# Patient Record
Sex: Male | Born: 1970 | Race: White | Hispanic: No | Marital: Single | Smoking: Current every day smoker
Health system: Southern US, Community
[De-identification: ages and names within clinical notes are randomized; demographics above are authoritative.]

## PROBLEM LIST (undated history)

## (undated) DIAGNOSIS — I10 Essential (primary) hypertension: Secondary | ICD-10-CM

## (undated) DIAGNOSIS — F1910 Other psychoactive substance abuse, uncomplicated: Secondary | ICD-10-CM

## (undated) HISTORY — DX: Essential (primary) hypertension: I10

## (undated) HISTORY — PX: OTHER SURGICAL HISTORY: SHX169

## (undated) HISTORY — PX: KNEE SURGERY: SHX244

---

## 2003-02-25 ENCOUNTER — Encounter: Payer: Self-pay | Admitting: Emergency Medicine

## 2003-02-25 ENCOUNTER — Emergency Department (HOSPITAL_COMMUNITY): Admission: EM | Admit: 2003-02-25 | Discharge: 2003-02-25 | Payer: Self-pay | Admitting: Emergency Medicine

## 2004-01-12 ENCOUNTER — Emergency Department (HOSPITAL_COMMUNITY): Admission: EM | Admit: 2004-01-12 | Discharge: 2004-01-13 | Payer: Self-pay | Admitting: Emergency Medicine

## 2005-12-02 ENCOUNTER — Emergency Department (HOSPITAL_COMMUNITY): Admission: EM | Admit: 2005-12-02 | Discharge: 2005-12-02 | Payer: Self-pay | Admitting: Emergency Medicine

## 2007-06-14 ENCOUNTER — Emergency Department (HOSPITAL_COMMUNITY): Admission: EM | Admit: 2007-06-14 | Discharge: 2007-06-14 | Payer: Self-pay | Admitting: Emergency Medicine

## 2008-06-27 ENCOUNTER — Emergency Department (HOSPITAL_COMMUNITY): Admission: EM | Admit: 2008-06-27 | Discharge: 2008-06-27 | Payer: Self-pay | Admitting: Emergency Medicine

## 2008-11-08 ENCOUNTER — Emergency Department (HOSPITAL_COMMUNITY): Admission: EM | Admit: 2008-11-08 | Discharge: 2008-11-08 | Payer: Self-pay | Admitting: Emergency Medicine

## 2008-12-23 ENCOUNTER — Emergency Department (HOSPITAL_COMMUNITY): Admission: EM | Admit: 2008-12-23 | Discharge: 2008-12-23 | Payer: Self-pay | Admitting: Emergency Medicine

## 2009-01-06 ENCOUNTER — Emergency Department (HOSPITAL_COMMUNITY): Admission: EM | Admit: 2009-01-06 | Discharge: 2009-01-06 | Payer: Self-pay | Admitting: Emergency Medicine

## 2009-02-25 ENCOUNTER — Emergency Department (HOSPITAL_COMMUNITY): Admission: EM | Admit: 2009-02-25 | Discharge: 2009-02-25 | Payer: Self-pay | Admitting: Emergency Medicine

## 2009-03-30 ENCOUNTER — Emergency Department (HOSPITAL_COMMUNITY): Admission: EM | Admit: 2009-03-30 | Discharge: 2009-03-30 | Payer: Self-pay | Admitting: Emergency Medicine

## 2010-04-02 ENCOUNTER — Emergency Department (HOSPITAL_COMMUNITY): Admission: EM | Admit: 2010-04-02 | Discharge: 2010-04-02 | Payer: Self-pay | Admitting: Emergency Medicine

## 2010-07-29 LAB — BASIC METABOLIC PANEL
CO2: 26 mEq/L (ref 19–32)
Calcium: 9.7 mg/dL (ref 8.4–10.5)
GFR calc Af Amer: >60 mL/min (ref >60)
GFR calc non Af Amer: >60 mL/min (ref >60)
Sodium: 140 mEq/L (ref 135–145)

## 2010-07-29 LAB — CBC
Hemoglobin: 16.3 g/dL (ref 13.0–17.0)
RBC: 5.06 MIL/uL (ref 4.22–5.81)
WBC: 8.5 10*3/uL (ref 4.0–10.5)

## 2010-07-29 LAB — URINALYSIS, ROUTINE W REFLEX MICROSCOPIC
Bilirubin Urine: NEGATIVE
Glucose, UA: NEGATIVE mg/dL
Hgb urine dipstick: NEGATIVE
Ketones, ur: NEGATIVE mg/dL
Nitrite: NEGATIVE
Protein, ur: NEGATIVE mg/dL
Specific Gravity, Urine: 1.013 (ref 1.005–1.030)
Urobilinogen, UA: 0.2 mg/dL (ref 0.0–1.0)
pH: 6.5 (ref 5.0–8.0)

## 2010-07-29 LAB — DIFFERENTIAL
Lymphocytes Relative: 25 % (ref 12–46)
Lymphs Abs: 2.1 10*3/uL (ref 0.7–4.0)
Monocytes Relative: 9 % (ref 3–12)
Neutro Abs: 5.5 10*3/uL (ref 1.7–7.7)
Neutrophils Relative %: 65 % (ref 43–77)

## 2010-07-29 LAB — POCT I-STAT, CHEM 8
BUN: 5 mg/dL — ABNORMAL LOW (ref 6–23)
Hemoglobin: 17 g/dL (ref 13.0–17.0)
Potassium: 4.1 mEq/L (ref 3.5–5.1)
TCO2: 26 mmol/L (ref 0–100)

## 2010-08-23 LAB — WOUND CULTURE

## 2010-08-25 LAB — DIFFERENTIAL
Basophils Absolute: 0 K/uL (ref 0.0–0.1)
Basophils Relative: 0 % (ref 0–1)
Eosinophils Absolute: 0.5 K/uL (ref 0.0–0.7)
Eosinophils Relative: 5 % (ref 0–5)
Lymphocytes Relative: 25 % (ref 12–46)
Lymphs Abs: 2.4 10*3/uL (ref 0.7–4.0)
Monocytes Absolute: 0.8 10*3/uL (ref 0.1–1.0)
Monocytes Relative: 8 % (ref 3–12)
Neutro Abs: 5.9 10*3/uL (ref 1.7–7.7)
Neutrophils Relative %: 62 % (ref 43–77)

## 2010-08-25 LAB — CBC
HCT: 52.8 % — ABNORMAL HIGH (ref 39.0–52.0)
Hemoglobin: 18 g/dL — ABNORMAL HIGH (ref 13.0–17.0)
MCHC: 34.1 g/dL (ref 30.0–36.0)
MCV: 94.2 fL (ref 78.0–100.0)
Platelets: 254 10*3/uL (ref 150–400)
RBC: 5.61 MIL/uL (ref 4.22–5.81)
RDW: 13.3 % (ref 11.5–15.5)
WBC: 9.6 K/uL (ref 4.0–10.5)

## 2010-08-25 LAB — COMPREHENSIVE METABOLIC PANEL WITH GFR
ALT: 28 U/L (ref 0–53)
AST: 22 U/L (ref 0–37)
CO2: 27 meq/L (ref 19–32)
Chloride: 104 meq/L (ref 96–112)
Creatinine, Ser: 1.02 mg/dL (ref 0.4–1.5)
GFR calc Af Amer: >60 mL/min (ref >60)
GFR calc non Af Amer: >60 mL/min (ref >60)
Glucose, Bld: 82 mg/dL (ref 70–99)
Total Bilirubin: 0.7 mg/dL (ref 0.3–1.2)

## 2010-08-25 LAB — COMPREHENSIVE METABOLIC PANEL
Albumin: 4.8 g/dL (ref 3.5–5.2)
Alkaline Phosphatase: 61 U/L (ref 39–117)
BUN: 10 mg/dL (ref 6–23)
Calcium: 10 mg/dL (ref 8.4–10.5)
Potassium: 4 mEq/L (ref 3.5–5.1)
Sodium: 140 mEq/L (ref 135–145)
Total Protein: 7.7 g/dL (ref 6.0–8.3)

## 2010-08-25 LAB — URINALYSIS, ROUTINE W REFLEX MICROSCOPIC
Bilirubin Urine: NEGATIVE
Glucose, UA: NEGATIVE mg/dL
Hgb urine dipstick: NEGATIVE
Ketones, ur: NEGATIVE mg/dL
Nitrite: NEGATIVE
Protein, ur: NEGATIVE mg/dL
Specific Gravity, Urine: 1.015 (ref 1.005–1.030)
Urobilinogen, UA: 0.2 mg/dL (ref 0.0–1.0)
pH: 7 (ref 5.0–8.0)

## 2010-08-25 LAB — LIPASE, BLOOD: Lipase: 38 U/L (ref 11–59)

## 2010-09-08 ENCOUNTER — Ambulatory Visit (INDEPENDENT_AMBULATORY_CARE_PROVIDER_SITE_OTHER): Payer: Self-pay | Admitting: Internal Medicine

## 2010-09-08 ENCOUNTER — Encounter: Payer: Self-pay | Admitting: Internal Medicine

## 2010-09-08 VITALS — BP 120/88 | HR 90 | Ht 72.5 in | Wt 161.0 lb

## 2010-09-08 DIAGNOSIS — M25569 Pain in unspecified knee: Secondary | ICD-10-CM

## 2010-09-08 DIAGNOSIS — R634 Abnormal weight loss: Secondary | ICD-10-CM

## 2010-09-08 DIAGNOSIS — G4700 Insomnia, unspecified: Secondary | ICD-10-CM

## 2010-09-08 LAB — CBC WITH DIFFERENTIAL/PLATELET
Basophils Relative: 0.7 % (ref 0.0–3.0)
Eosinophils Absolute: 0.3 10*3/uL (ref 0.0–0.7)
Eosinophils Relative: 3.6 % (ref 0.0–5.0)
HCT: 41.9 % (ref 39.0–52.0)
Hemoglobin: 14.7 g/dL (ref 13.0–17.0)
Lymphs Abs: 3 10*3/uL (ref 0.7–4.0)
MCHC: 35 g/dL (ref 30.0–36.0)
MCV: 92.5 fl (ref 78.0–100.0)
Monocytes Absolute: 0.8 10*3/uL (ref 0.1–1.0)
Neutro Abs: 5.2 10*3/uL (ref 1.4–7.7)
RBC: 4.53 Mil/uL (ref 4.22–5.81)
WBC: 9.4 10*3/uL (ref 4.5–10.5)

## 2010-09-08 LAB — TSH: TSH: 1.97 u[IU]/mL (ref 0.35–5.50)

## 2010-09-08 LAB — HEPATIC FUNCTION PANEL
Albumin: 4.1 g/dL (ref 3.5–5.2)
Alkaline Phosphatase: 43 U/L (ref 39–117)
Total Protein: 6.5 g/dL (ref 6.0–8.3)

## 2010-09-08 LAB — BASIC METABOLIC PANEL
BUN: 13 mg/dL (ref 6–23)
Creatinine, Ser: 0.9 mg/dL (ref 0.4–1.5)
GFR: 101.42 mL/min (ref >60.00)
Glucose, Bld: 76 mg/dL (ref 70–99)

## 2010-09-08 MED ORDER — SULFAMETHOXAZOLE-TRIMETHOPRIM 800-160 MG PO TABS: 1.0000 | ORAL_TABLET | Freq: Two times a day (BID) | ORAL | Status: AC

## 2010-09-08 MED ORDER — HYDROCODONE-ACETAMINOPHEN 5-500 MG PO TABS: 2.0000 | ORAL_TABLET | Freq: Four times a day (QID) | ORAL | Status: AC | PRN

## 2010-09-08 MED ORDER — IBUPROFEN 800 MG PO TABS: 800.0000 mg | ORAL_TABLET | Freq: Three times a day (TID) | ORAL | Status: AC | PRN

## 2010-09-14 ENCOUNTER — Encounter: Payer: Self-pay | Admitting: Internal Medicine

## 2010-09-14 DIAGNOSIS — M25569 Pain in unspecified knee: Secondary | ICD-10-CM | POA: Insufficient documentation

## 2010-09-14 DIAGNOSIS — G4700 Insomnia, unspecified: Secondary | ICD-10-CM | POA: Insufficient documentation

## 2010-09-14 DIAGNOSIS — R634 Abnormal weight loss: Secondary | ICD-10-CM | POA: Insufficient documentation

## 2010-09-14 NOTE — Progress Notes (Signed)
  Subjective:    Patient ID: Aaron Roberson, male    DOB: [birth year], 40 y.o.   MRN: [id number]  HPI patient is a sclerotic establish primary care.States an intended weight loss of approximately 25 pounds over the past year. Has had no evaluation.No fever chills or sweats. Denies recent illnesses. Does have history of possible prostatitis and complains of nocturia 3 times a night and urinary difficulty. No hematuria. States has undergone multiple knee surgeries and is currently deferring additional knee surgery. Has intermittent mild effusions. Has no instability. Previous x-ray of right knee reviewed February 2010 demonstrating slight effusion and no other abnormalities. Planes of recent insomnia with difficulty initiating sleep.  Reviewed past medical history, past surgical history, medications, allergies, social history and family history.  Review of Systems  Constitutional: Positive for unexpected weight change.  Genitourinary: Positive for difficulty urinating. Negative for hematuria.  Musculoskeletal: Positive for joint swelling and arthralgias. Negative for back pain and gait problem.  Psychiatric/Behavioral: Positive for sleep disturbance.  All other systems reviewed and are negative.       Objective:   Physical Exam    Physical Exam  Vitals reviewed. Constitutional:  appears well-developed and well-nourished. No distress.  HENT:  Head: Normocephalic and atraumatic.  Right Ear: Tympanic membrane, external ear and ear canal normal.  Left Ear: Tympanic membrane, external ear and ear canal normal.  Nose: Nose normal.  Mouth/Throat: Oropharynx is clear and moist. No oropharyngeal exudate.  Eyes: Conjunctivae and EOM are normal. Pupils are equal, round, and reactive to light. Right eye exhibits no discharge. Left eye exhibits no discharge. No scleral icterus.  Neck: Neck supple. No thyromegaly present.  Cardiovascular: Normal rate, regular rhythm and normal heart sounds.  Exam  reveals no gallop and no friction rub.   No murmur heard. Pulmonary/Chest: Effort normal and breath sounds normal. No respiratory distress.  has no wheezes.  has no rales.  Abdomen: Soft, nondistended nontender, positive bowel sounds. No masses or organomegaly appreciated. Lymphadenopathy:   no cervical adenopathy.  Neurological:  is alert.  Skin: Skin is warm and dry.  not diaphoretic.  Muscle skeletal: Right knee reveals possible slight effusion. Nontender.Full range of motion. Able to weight-bear and ambulate without assistance. Psychiatric: normal mood and affect.      Assessment & Plan:

## 2010-09-14 NOTE — Assessment & Plan Note (Signed)
Unintended. Obtain CBC, Chem-7, liver function test and TSH. Schedule chest x-ray. Schedule followup for reevaluation.

## 2010-09-14 NOTE — Assessment & Plan Note (Signed)
Recommend OTC sleep aid p.r.n.

## 2010-09-14 NOTE — Assessment & Plan Note (Signed)
Chronic. AttemptsMotrin with food and other anti-inflammatories. Short duration of hydrocodone. Advise will not be long-term medication.Caution regarding possible sedating effect as well as addiction and/or tolerance. If pain persists recommend orthopedic followup

## 2010-09-15 ENCOUNTER — Telehealth: Payer: Self-pay

## 2010-09-15 NOTE — Telephone Encounter (Signed)
Message copied by Kyung Rudd on Mon Sep 15, 2010  3:03 PM ------      Message from: Letitia Libra, Maisie Fus      Created: Sun Sep 14, 2010 11:21 AM       Labs nl

## 2010-09-17 ENCOUNTER — Telehealth: Payer: Self-pay

## 2010-09-17 NOTE — Telephone Encounter (Signed)
Message copied by Kyung Rudd on Wed Sep 17, 2010 11:06 AM ------      Message from: Letitia Libra, Maisie Fus      Created: Sun Sep 14, 2010 11:21 AM       Labs nl

## 2010-10-06 ENCOUNTER — Telehealth: Payer: Self-pay | Admitting: Internal Medicine

## 2010-10-06 ENCOUNTER — Other Ambulatory Visit: Payer: Self-pay

## 2010-10-06 MED ORDER — TRAMADOL HCL 50 MG PO TABS: 50.0000 mg | ORAL_TABLET | Freq: Four times a day (QID) | ORAL | Status: AC | PRN

## 2010-10-06 NOTE — Telephone Encounter (Signed)
As discussed in clinic if pain persists recommend ortho evaluation. Ok for ultram 50mg  po q6 hours prn pain #30 rf 1 if no interactions.

## 2010-10-06 NOTE — Telephone Encounter (Signed)
Pt called and is req to get an early refill of Hydrocodone for knee pain. Pt is extreme amount of pain. Due to be fill on 10/08/10. Pt out of med. Pls call in to [facility] on [address].

## 2010-10-06 NOTE — Telephone Encounter (Signed)
Rx denied on Friday, 10/03/2010, by Dr. Rodena Medin. Per Dr. Rodena Medin, pt would have to be willing to go to orthopedics before getting pain meds. Pt states that he does not have insurance yet and requests that Dr. Rodena Medin rx something for pain even if it is not a narcotic. Pls advise

## 2010-10-06 NOTE — Telephone Encounter (Signed)
Refill denied by Dr. Rodena Medin. Pt received refills on 09/08/2010 and 09/13/2010

## 2010-10-20 ENCOUNTER — Ambulatory Visit: Payer: Self-pay | Admitting: Internal Medicine

## 2010-10-20 ENCOUNTER — Emergency Department (HOSPITAL_COMMUNITY): Payer: Self-pay

## 2010-10-20 ENCOUNTER — Emergency Department (HOSPITAL_COMMUNITY)
Admission: EM | Admit: 2010-10-20 | Discharge: 2010-10-20 | Disposition: A | Discharge status: Home / Self Care | Payer: Self-pay | Attending: Emergency Medicine | Admitting: Emergency Medicine

## 2010-10-20 DIAGNOSIS — Z0289 Encounter for other administrative examinations: Secondary | ICD-10-CM

## 2010-10-20 DIAGNOSIS — M25469 Effusion, unspecified knee: Secondary | ICD-10-CM | POA: Insufficient documentation

## 2010-10-20 DIAGNOSIS — K089 Disorder of teeth and supporting structures, unspecified: Secondary | ICD-10-CM | POA: Insufficient documentation

## 2010-10-20 DIAGNOSIS — R209 Unspecified disturbances of skin sensation: Secondary | ICD-10-CM | POA: Insufficient documentation

## 2010-10-20 DIAGNOSIS — Z9889 Other specified postprocedural states: Secondary | ICD-10-CM | POA: Insufficient documentation

## 2011-01-25 ENCOUNTER — Emergency Department (HOSPITAL_COMMUNITY)
Admission: EM | Admit: 2011-01-25 | Discharge: 2011-01-25 | Disposition: A | Discharge status: Home / Self Care | Payer: Self-pay | Attending: Emergency Medicine | Admitting: Emergency Medicine

## 2011-01-25 DIAGNOSIS — K089 Disorder of teeth and supporting structures, unspecified: Secondary | ICD-10-CM | POA: Insufficient documentation

## 2011-01-25 DIAGNOSIS — K029 Dental caries, unspecified: Secondary | ICD-10-CM | POA: Insufficient documentation

## 2011-04-05 ENCOUNTER — Emergency Department (HOSPITAL_COMMUNITY)
Admission: EM | Admit: 2011-04-05 | Discharge: 2011-04-05 | Disposition: A | Discharge status: Home / Self Care | Payer: Self-pay

## 2012-04-13 ENCOUNTER — Other Ambulatory Visit: Payer: Self-pay | Admitting: Family Medicine

## 2012-07-17 ENCOUNTER — Encounter (HOSPITAL_COMMUNITY): Payer: Self-pay | Admitting: Emergency Medicine

## 2012-07-17 ENCOUNTER — Emergency Department (HOSPITAL_COMMUNITY)
Admission: EM | Admit: 2012-07-17 | Discharge: 2012-07-17 | Disposition: A | Discharge status: Home / Self Care | Payer: Self-pay | Attending: Emergency Medicine | Admitting: Emergency Medicine

## 2012-07-17 DIAGNOSIS — K0889 Other specified disorders of teeth and supporting structures: Secondary | ICD-10-CM

## 2012-07-17 DIAGNOSIS — H9319 Tinnitus, unspecified ear: Secondary | ICD-10-CM | POA: Insufficient documentation

## 2012-07-17 DIAGNOSIS — F17200 Nicotine dependence, unspecified, uncomplicated: Secondary | ICD-10-CM | POA: Insufficient documentation

## 2012-07-17 DIAGNOSIS — I10 Essential (primary) hypertension: Secondary | ICD-10-CM | POA: Insufficient documentation

## 2012-07-17 DIAGNOSIS — K089 Disorder of teeth and supporting structures, unspecified: Secondary | ICD-10-CM | POA: Insufficient documentation

## 2012-07-17 DIAGNOSIS — K029 Dental caries, unspecified: Secondary | ICD-10-CM | POA: Insufficient documentation

## 2012-07-17 DIAGNOSIS — R6884 Jaw pain: Secondary | ICD-10-CM | POA: Insufficient documentation

## 2012-07-17 MED ORDER — OXYCODONE-ACETAMINOPHEN 5-325 MG PO TABS: 1.0000 | ORAL_TABLET | Freq: Four times a day (QID) | ORAL | Status: DC | PRN

## 2012-07-17 MED ORDER — HYDROCODONE-ACETAMINOPHEN 5-325 MG PO TABS
1.0000 | ORAL_TABLET | Freq: Once | ORAL | Status: AC
  Administered 2012-07-17: 1 via ORAL
  Filled 2012-07-17: qty 1

## 2012-07-17 MED ORDER — AMOXICILLIN 500 MG PO CAPS: 500.0000 mg | ORAL_CAPSULE | Freq: Three times a day (TID) | ORAL | Status: DC

## 2012-07-17 MED ORDER — ONDANSETRON HCL 4 MG PO TABS: 4.0000 mg | ORAL_TABLET | Freq: Four times a day (QID) | ORAL | Status: DC

## 2012-07-17 NOTE — ED Notes (Signed)
Pt reports several broken teeth. One in the right lower quadrant, another in the left lower quadrant, and another on the right upper quadrant. Oral cavity visualized and teeth are broken and are at the level of the gum line. Pt also reporting tinnitus.

## 2012-07-17 NOTE — ED Provider Notes (Signed)
History    This chart was scribed for non-physician practitioner working with Suzi Roots, MD by Melba Coon, ED Scribe. This patient was seen in room WTR6/WTR6 and the patient's care was started at 7:40PM.     CSN: [id number]  Arrival date & time 07/17/12  1907   First MD Initiated Contact with Patient 07/17/12 1932      Chief Complaint  Patient presents with  . Dental Pain  . Tinnitus    (Consider location/radiation/quality/duration/timing/severity/associated sxs/prior treatment) The history is provided by the patient. No language interpreter was used.   Aaron Roberson is a 42 y.o. male who presents to the Emergency Department complaining of constant, moderate to severe right lower, right upper,and left lower quadrant dental pain with an onset within the past week pertaining to dental fracture and cavity. He reports that he has had cavities and dental fractures in the past but only lately has he had pain severe enough to present to the ED. He reports jaw pain with difficulty chewing. He reports drainage with a nasty taste in his mouth. He reports some tinnitus as well. Denies fever, neck pain, sore throat, rash, back pain, CP, SOB, abdominal pain, nausea, emesis, diarrhea, dysuria, or extremity pain, edema, weakness, numbness, or tingling. No known allergies. No other pertinent medical symptoms.  Past Medical History  Diagnosis Date  . Hypertension     Past Surgical History  Procedure Laterality Date  . Knee surgery      x 3    Family History  Problem Relation Age of Onset  . COPD Mother   . Stroke Mother   . Hypertension Mother   . Cancer Father     prostate  . Hypertension Father   . Arthritis Maternal Grandfather   . Cancer Paternal Grandmother     breast    History  Substance Use Topics  . Smoking status: Current Every Day Smoker -- 1.00 packs/day    Types: Cigarettes  . Smokeless tobacco: Not on file  . Alcohol Use: No      Review of Systems 10  Systems reviewed and all are negative for acute change except as noted in the HPI.   Allergies  Review of patient's allergies indicates no known allergies.  Home Medications   Current Outpatient Rx  Name  Route  Sig  Dispense  Refill  . ibuprofen (ADVIL,MOTRIN) 200 MG tablet   Oral   Take 200 mg by mouth every 6 (six) hours as needed (PAIN).         Marland Kitchen amoxicillin (AMOXIL) 500 MG capsule   Oral   Take 1 capsule (500 mg total) by mouth 3 (three) times daily.   21 capsule   0   . ondansetron (ZOFRAN) 4 MG tablet   Oral   Take 1 tablet (4 mg total) by mouth every 6 (six) hours.   12 tablet   0   . oxyCODONE-acetaminophen (PERCOCET/ROXICET) 5-325 MG per tablet   Oral   Take 1 tablet by mouth every 6 (six) hours as needed for pain.   15 tablet   0     BP 136/91  Pulse 90  Temp(Src) 98.1 F (36.7 C) (Oral)  Resp 18  SpO2 98%  Physical Exam  Nursing note and vitals reviewed. Constitutional: He is oriented to person, place, and time. He appears well-developed and well-nourished. No distress.  HENT:  Head: Normocephalic and atraumatic. No trismus in the jaw.  Mouth/Throat: No oral lesions. Dental caries present. No lacerations.  Diffuse dental decay. No obvious abscess noted. Multiple broken teeth.  Eyes: Conjunctivae and EOM are normal. Pupils are equal, round, and reactive to light.  Neck: Normal range of motion. Neck supple. No tracheal deviation present.  Cardiovascular: Normal rate and regular rhythm.   Pulmonary/Chest: Effort normal and breath sounds normal. No respiratory distress.  Musculoskeletal: Normal range of motion.  Neurological: He is alert and oriented to person, place, and time.  Skin: Skin is warm and dry.  Psychiatric: He has a normal mood and affect. His behavior is normal.    ED Course  Procedures (including critical care time)  DIAGNOSTIC STUDIES: Oxygen Saturation is 98% on room air, normal by my interpretation.    COORDINATION OF  CARE:  7:43PM - pain medications and antibiotics will be ordered for [facility]. He will be referred to the on-call dentist. Advised of 24-48 hour follow-up rule.   Labs Reviewed - No data to display No results found.   1. Pain, dental       MDM  I personally performed the services described in this documentation, which was scribed in my presence. The recorded information has been reviewed and is accurate.       Dorthula Matas, PA 07/17/12 2016

## 2012-07-19 NOTE — ED Provider Notes (Signed)
Medical screening examination/treatment/procedure(s) were performed by non-physician practitioner and as supervising physician I was immediately available for consultation/collaboration.   Lashuna Tamashiro E Jalea Bronaugh, MD 07/19/12 0724 

## 2012-09-18 ENCOUNTER — Emergency Department (HOSPITAL_COMMUNITY)
Admission: EM | Admit: 2012-09-18 | Discharge: 2012-09-18 | Disposition: A | Discharge status: Home / Self Care | Payer: Self-pay | Attending: Emergency Medicine | Admitting: Emergency Medicine

## 2012-09-18 ENCOUNTER — Encounter (HOSPITAL_COMMUNITY): Payer: Self-pay | Admitting: *Deleted

## 2012-09-18 DIAGNOSIS — H9319 Tinnitus, unspecified ear: Secondary | ICD-10-CM | POA: Insufficient documentation

## 2012-09-18 DIAGNOSIS — K0889 Other specified disorders of teeth and supporting structures: Secondary | ICD-10-CM

## 2012-09-18 DIAGNOSIS — K089 Disorder of teeth and supporting structures, unspecified: Secondary | ICD-10-CM | POA: Insufficient documentation

## 2012-09-18 DIAGNOSIS — K029 Dental caries, unspecified: Secondary | ICD-10-CM | POA: Insufficient documentation

## 2012-09-18 DIAGNOSIS — Z8781 Personal history of (healed) traumatic fracture: Secondary | ICD-10-CM | POA: Insufficient documentation

## 2012-09-18 DIAGNOSIS — Z7982 Long term (current) use of aspirin: Secondary | ICD-10-CM | POA: Insufficient documentation

## 2012-09-18 DIAGNOSIS — F17200 Nicotine dependence, unspecified, uncomplicated: Secondary | ICD-10-CM | POA: Insufficient documentation

## 2012-09-18 DIAGNOSIS — R6884 Jaw pain: Secondary | ICD-10-CM | POA: Insufficient documentation

## 2012-09-18 DIAGNOSIS — I10 Essential (primary) hypertension: Secondary | ICD-10-CM | POA: Insufficient documentation

## 2012-09-18 MED ORDER — AMOXICILLIN 500 MG PO CAPS: 500.0000 mg | ORAL_CAPSULE | Freq: Three times a day (TID) | ORAL | Status: DC

## 2012-09-18 MED ORDER — OXYCODONE-ACETAMINOPHEN 5-325 MG PO TABS: 1.0000 | ORAL_TABLET | ORAL | Status: DC | PRN

## 2012-09-18 NOTE — ED Notes (Signed)
Pt states having dental pain, states just recently had teeth pulled but having problems where one was pulled top R and then dental pain top L.

## 2012-09-18 NOTE — ED Provider Notes (Signed)
Medical screening examination/treatment/procedure(s) were performed by non-physician practitioner and as supervising physician I was immediately available for consultation/collaboration.  Flint Melter, MD 09/18/12 [phone]

## 2012-09-18 NOTE — ED Provider Notes (Signed)
History     CSN: [id number]  Arrival date & time 09/18/12  1139   First MD Initiated Contact with Patient 09/18/12 1243      Chief Complaint  Patient presents with  . Dental Pain    (Consider location/radiation/quality/duration/timing/severity/associated sxs/prior treatment) HPI The history is provided by the patient. No language interpreter was used.   Aaron Roberson is a 42 y.o. male who presents to the Emergency Department complaining of constant, moderate to severe right lower, right upper,and left lower quadrant dental pain with an onset within the past week pertaining to dental fracture and cavity. He reports that he has had cavities and dental fractures in the past but only lately has he had pain severe enough to present to the ED. He reports jaw pain with difficulty chewing. He reports drainage with a nasty taste in his mouth. He reports some tinnitus as well. Denies fever, neck pain, sore throat, rash, back pain, CP, SOB, abdominal pain, nausea, emesis, diarrhea, dysuria, or extremity pain, edema, weakness, numbness, or tingling. No known allergies. No other pertinent medical symptoms.  He saw dentist 1 month ago after referral from ED and Dr. Leanord Asal pulled 3 of his teeth free of charge.   Past Medical History  Diagnosis Date  . Hypertension     Past Surgical History  Procedure Laterality Date  . Knee surgery      x 3    Family History  Problem Relation Age of Onset  . COPD Mother   . Stroke Mother   . Hypertension Mother   . Cancer Father     prostate  . Hypertension Father   . Arthritis Maternal Grandfather   . Cancer Paternal Grandmother     breast    History  Substance Use Topics  . Smoking status: Current Every Day Smoker -- 1.00 packs/day    Types: Cigarettes  . Smokeless tobacco: Not on file  . Alcohol Use: No      Review of Systems  All other systems reviewed and are negative.    Allergies  Review of patient's allergies indicates no  known allergies.  Home Medications   Current Outpatient Rx  Name  Route  Sig  Dispense  Refill  . aspirin 325 MG tablet   Oral   Take 325 mg by mouth every 6 (six) hours as needed for pain.           BP 113/86  Pulse 90  Temp(Src) 98 F (36.7 C) (Oral)  Resp 16  Ht 6' 1.5" (1.867 m)  Wt 155 lb (70.308 kg)  BMI 20.17 kg/m2  SpO2 98%  Physical Exam  Nursing note and vitals reviewed. Constitutional: He appears well-developed and well-nourished.  HENT:  Head: Normocephalic and atraumatic.  Mouth/Throat: Dental caries present.    Eyes: Conjunctivae and EOM are normal. Pupils are equal, round, and reactive to light.  Neck: Normal range of motion. Neck supple.  Cardiovascular: Normal rate and regular rhythm.   Pulmonary/Chest: Effort normal and breath sounds normal.    ED Course  Procedures (including critical care time)  Labs Reviewed - No data to display No results found.   No diagnosis found.  Dx: Toothache  MDM  Patient has dental pain. No emergent s/sx's present. Patent airway. No trismus.  Will be given pain medication and antibiotics. I discussed the need to call dentist within 24/48 hours for follow-up. Dental referral given. Return to ED precautions given.  Pt voiced understanding and has agreed to follow-up.  Dorthula Matas, PA-C 09/18/12 1244

## 2012-09-22 ENCOUNTER — Emergency Department (HOSPITAL_COMMUNITY)
Admission: EM | Admit: 2012-09-22 | Discharge: 2012-09-22 | Disposition: A | Discharge status: Home / Self Care | Payer: Self-pay | Attending: Emergency Medicine | Admitting: Emergency Medicine

## 2012-09-22 ENCOUNTER — Encounter (HOSPITAL_COMMUNITY): Payer: Self-pay | Admitting: Emergency Medicine

## 2012-09-22 DIAGNOSIS — Z7982 Long term (current) use of aspirin: Secondary | ICD-10-CM | POA: Insufficient documentation

## 2012-09-22 DIAGNOSIS — Y9289 Other specified places as the place of occurrence of the external cause: Secondary | ICD-10-CM | POA: Insufficient documentation

## 2012-09-22 DIAGNOSIS — I10 Essential (primary) hypertension: Secondary | ICD-10-CM | POA: Insufficient documentation

## 2012-09-22 DIAGNOSIS — S99919A Unspecified injury of unspecified ankle, initial encounter: Secondary | ICD-10-CM | POA: Insufficient documentation

## 2012-09-22 DIAGNOSIS — F17200 Nicotine dependence, unspecified, uncomplicated: Secondary | ICD-10-CM | POA: Insufficient documentation

## 2012-09-22 DIAGNOSIS — Z9889 Other specified postprocedural states: Secondary | ICD-10-CM | POA: Insufficient documentation

## 2012-09-22 DIAGNOSIS — Y990 Civilian activity done for income or pay: Secondary | ICD-10-CM | POA: Insufficient documentation

## 2012-09-22 DIAGNOSIS — X500XXA Overexertion from strenuous movement or load, initial encounter: Secondary | ICD-10-CM | POA: Insufficient documentation

## 2012-09-22 DIAGNOSIS — M25561 Pain in right knee: Secondary | ICD-10-CM

## 2012-09-22 DIAGNOSIS — S8990XA Unspecified injury of unspecified lower leg, initial encounter: Secondary | ICD-10-CM | POA: Insufficient documentation

## 2012-09-22 DIAGNOSIS — Y9389 Activity, other specified: Secondary | ICD-10-CM | POA: Insufficient documentation

## 2012-09-22 MED ORDER — OXYCODONE-ACETAMINOPHEN 5-325 MG PO TABS
1.0000 | ORAL_TABLET | Freq: Once | ORAL | Status: AC
  Administered 2012-09-22: 1 via ORAL
  Filled 2012-09-22: qty 1

## 2012-09-22 MED ORDER — NAPROXEN 500 MG PO TABS: 500.0000 mg | ORAL_TABLET | Freq: Two times a day (BID) | ORAL | Status: DC

## 2012-09-22 NOTE — ED Notes (Signed)
Pt c/o right knee pain after twisting 2 days; pt sts hx of same in past

## 2012-09-22 NOTE — ED Notes (Signed)
Pt refused wanting xray prior to seeing doctor

## 2012-09-22 NOTE — ED Provider Notes (Signed)
History     CSN: [id number]  Arrival date & time 09/22/12  1203   First MD Initiated Contact with Patient 09/22/12 1419      Chief Complaint  Patient presents with  . Knee Pain    (Consider location/radiation/quality/duration/timing/severity/associated sxs/prior treatment) HPI Comments: Aaron Roberson is a 42 y/o M with PMHx of HTN and knee pain presenting to the ED with right knee pain. Patient reported that has has always had a problem with his right knee pain, but stated that it has been bothering him and has mildly gotten swollen over the past couple of days. Patient reported that he was crawling on his knees and climbing up ladders and stairs for work the day before yesterday, stating that the pain has gotten worse since then. Patient described right knee to feel like "mush." Stated that he does have mild numbness to the right knee since his last surgery when the nerve was cut into. Patient reported that pain in the right knee is worse with motion. Stated that he has a knee sleeve, but has not been using it. Patient reported that he has not been using any medications to aid in his discomfort. Reported that he is going to make an appointment with Dr. Simonne Come, from [facility], but has not had the chance due to his father just coming out of surgery and that he has been taking care of him. Denied numbness, tingling, fever, chills, loss of sensation to the leg, urinary symptoms, gi symptoms.  As per patient, stated that he has gotten 3 surgeries to the right knee - two surgeries in 1990, last surgery performed in 1991.   The history is provided by the patient. No language interpreter was used.    Past Medical History  Diagnosis Date  . Hypertension     Past Surgical History  Procedure Laterality Date  . Knee surgery      x 3    Family History  Problem Relation Age of Onset  . COPD Mother   . Stroke Mother   . Hypertension Mother   . Cancer Father     prostate  .  Hypertension Father   . Arthritis Maternal Grandfather   . Cancer Paternal Grandmother     breast    History  Substance Use Topics  . Smoking status: Current Every Day Smoker -- 1.00 packs/day    Types: Cigarettes  . Smokeless tobacco: Not on file  . Alcohol Use: No      Review of Systems  Constitutional: Negative for fever, chills and fatigue.  HENT: Negative for sore throat, mouth sores, neck pain, neck stiffness and tinnitus.   Eyes: Negative for pain and visual disturbance.  Respiratory: Negative for cough, chest tightness and shortness of breath.   Cardiovascular: Negative for chest pain.  Gastrointestinal: Negative for nausea, vomiting, abdominal pain, diarrhea and constipation.  Genitourinary: Negative for decreased urine volume and difficulty urinating.  Musculoskeletal: Positive for joint swelling and arthralgias.       Right knee pain and swelling  Skin: Negative for pallor, rash and wound.  Neurological: Negative for dizziness, weakness, light-headedness, numbness and headaches.  All other systems reviewed and are negative.    Allergies  Review of patient's allergies indicates no known allergies.  Home Medications   Current Outpatient Rx  Name  Route  Sig  Dispense  Refill  . amoxicillin (AMOXIL) 500 MG capsule   Oral   Take 500 mg by mouth 3 (three) times daily. 7 day  treatment. Started 09/18/2012 evening.         Marland Kitchen aspirin 325 MG tablet   Oral   Take 325 mg by mouth daily as needed for pain.         . naproxen (NAPROSYN) 500 MG tablet   Oral   Take 1 tablet (500 mg total) by mouth 2 (two) times daily.   30 tablet   0     BP 134/84  Pulse 66  Temp(Src) 97 F (36.1 C) (Oral)  Resp 16  SpO2 100%  Physical Exam  Nursing note and vitals reviewed. Constitutional: He is oriented to person, place, and time. He appears well-developed and well-nourished. No distress.  HENT:  Head: Normocephalic and atraumatic.  Neck: Normal range of motion. Neck  supple. No tracheal deviation present.  Negative nuchal rigidity Negative neck stiffness  Cardiovascular: Normal rate, regular rhythm and normal heart sounds.   Radial pulses 2+ bilaterally Pedal pulses 2+ bilaterally Negative leg and ankle swelling Negative pitting edema   Pulmonary/Chest: Effort normal and breath sounds normal. No respiratory distress. He has no wheezes. He has no rales. He exhibits no tenderness.  Musculoskeletal: He exhibits tenderness. He exhibits no edema.  Mild swelling noted to the right knee - negative inflammation, erythema, warmth to touch - no sign of infection of septic joint Mild pain upon palpation to posterior and medial aspect of the right knee  Limited ROM - flexion and extension - of right knee secondary to pain Negative laxity Negative posterior and anterior draw sign Negative MacMurrary's sign Negative valgus and varus tension pain Negative crepitus to right knee  Lymphadenopathy:    He has no cervical adenopathy.  Neurological: He is alert and oriented to person, place, and time. No cranial nerve deficit. He exhibits normal muscle tone. Coordination normal.  Sensation intact to upper and lower extremities with differentiation between sharp and dull sensation   Skin: Skin is warm and dry. No rash noted. He is not diaphoretic. No erythema.  Psychiatric: He has a normal mood and affect. His behavior is normal. Thought content normal.    ED Course  Procedures (including critical care time)  Labs Reviewed - No data to display No results found.   1. Right knee pain       MDM  Patient afebrile, normotensive, non-tachycardic, alert and oriented. No neurovascular damage noted. No erythema, inflammation, warmth to touch of right knee joint - no sign of infection, no sign of septic joint. Patient refused imaging - explained that his right knee has not been xrayed since 2012 - need to do one in order to compare and identify any changes to the joint.  Patient continuously refused, stated that he is going to follow-up with Dr. Simonne Come next week regarding pain and that he will get an xray then - has not called to set-up an appointment. Patient continuously refused imaging. Discussed concern about possible Baker's cyst, since patient applying pressure to knees and swelling noted posteriorly as well - patient continued to refuse any form of imaging at this time. Pain controlled in ED setting. Patient aseptic, non-toxic appearing, no acute distress. Discharged patient. Patient given naproxen PO as outpatient. Discussed care of knee with sleeve, elevation, rest, decrease strenuous activities, icing, icy-hot ointment and patches. Referred patient to orthopedics. Discussed with patient to monitor symptoms and if symptoms are to worsen to report back to the ED. Patient agreed to plan of care, understood, all questions answered.  Raymon Mutton, PA-C 09/22/12 1722

## 2012-09-23 NOTE — ED Provider Notes (Signed)
Medical screening examination/treatment/procedure(s) were performed by non-physician practitioner and as supervising physician I was immediately available for consultation/collaboration.  Ormand Senn R. Dondi Burandt, MD 09/23/12 0949 

## 2012-09-27 ENCOUNTER — Emergency Department (HOSPITAL_COMMUNITY)
Admission: EM | Admit: 2012-09-27 | Discharge: 2012-09-27 | Disposition: A | Discharge status: Home / Self Care | Payer: Self-pay | Attending: Emergency Medicine | Admitting: Emergency Medicine

## 2012-09-27 ENCOUNTER — Encounter (HOSPITAL_COMMUNITY): Payer: Self-pay | Admitting: *Deleted

## 2012-09-27 DIAGNOSIS — K089 Disorder of teeth and supporting structures, unspecified: Secondary | ICD-10-CM | POA: Insufficient documentation

## 2012-09-27 DIAGNOSIS — K0889 Other specified disorders of teeth and supporting structures: Secondary | ICD-10-CM

## 2012-09-27 DIAGNOSIS — I10 Essential (primary) hypertension: Secondary | ICD-10-CM | POA: Insufficient documentation

## 2012-09-27 DIAGNOSIS — F17200 Nicotine dependence, unspecified, uncomplicated: Secondary | ICD-10-CM | POA: Insufficient documentation

## 2012-09-27 DIAGNOSIS — K029 Dental caries, unspecified: Secondary | ICD-10-CM | POA: Insufficient documentation

## 2012-09-27 NOTE — ED Provider Notes (Signed)
History     CSN: [id number]  Arrival date & time 09/27/12  1107   First MD Initiated Contact with Patient 09/27/12 1216      Chief Complaint  Patient presents with  . Dental Pain    (Consider location/radiation/quality/duration/timing/severity/associated sxs/prior treatment) HPI   Aaron Roberson is a 42 y.o. male complaining of exacerbation of dental pain to the right and left upper side. Patient has been seen 2 times in the last week for various pain complaints. He says that he has called the dentist but the dentist has not called him back. He denies fever, gum swelling, difficulty handling his secretions, nausea or vomiting.    Past Medical History  Diagnosis Date  . Hypertension     Past Surgical History  Procedure Laterality Date  . Knee surgery      x 3    Family History  Problem Relation Age of Onset  . COPD Mother   . Stroke Mother   . Hypertension Mother   . Cancer Father     prostate  . Hypertension Father   . Arthritis Maternal Grandfather   . Cancer Paternal Grandmother     breast    History  Substance Use Topics  . Smoking status: Current Every Day Smoker -- 1.00 packs/day    Types: Cigarettes  . Smokeless tobacco: Not on file  . Alcohol Use: No      Review of Systems  Constitutional: Negative for fever, chills, activity change and appetite change.  HENT: Positive for dental problem. Negative for sore throat, drooling, mouth sores and trouble swallowing.   Respiratory: Negative for shortness of breath.   Cardiovascular: Negative for chest pain.  Gastrointestinal: Negative for nausea, vomiting, diarrhea and constipation.  Musculoskeletal: Negative for myalgias.  Skin: Negative for rash.  All other systems reviewed and are negative.    Allergies  Review of patient's allergies indicates no known allergies.  Home Medications   Current Outpatient Rx  Name  Route  Sig  Dispense  Refill  . amoxicillin (AMOXIL) 500 MG capsule   Oral  Take 500 mg by mouth 3 (three) times daily. 7 day treatment. Started 09/18/2012 evening.           BP 109/61  Pulse 73  Temp(Src) 97.8 F (36.6 C) (Oral)  Resp 16  SpO2 97%  Physical Exam  Nursing note and vitals reviewed. Constitutional: He is oriented to person, place, and time. He appears well-developed and well-nourished. No distress.  HENT:  Head: Normocephalic.  Mouth/Throat: Oropharynx is clear and moist.  Several moderate dental caries, no gingival swelling, erythema or tenderness to palpation. Patient is handling their secretions. There is no tenderness to palpation or firmness underneath tongue bilaterally. No trismus.   Eyes: Conjunctivae and EOM are normal. Pupils are equal, round, and reactive to light.  Neck: Normal range of motion.  Cardiovascular: Normal rate.   Pulmonary/Chest: Effort normal. No stridor.  Abdominal: Soft.  Musculoskeletal: Normal range of motion.  Neurological: He is alert and oriented to person, place, and time.  Psychiatric: He has a normal mood and affect.    ED Course  Procedures (including critical care time)  Labs Reviewed - No data to display No results found.   No diagnosis found.    MDM   Filed Vitals:   09/27/12 1148  BP: 109/61  Pulse: 73  Temp: 97.8 F (36.6 C)  TempSrc: Oral  Resp: 16  SpO2: 97%     Aaron Roberson is a 42  y.o. male with bilateral upper dental pain.  No gross abscess.  Exam unconcerning for Ludwig's angina or spread of infection.   I recommended a dental nerve block, which he refused.  Urged patient to follow-up with dentist.    VSS and patient is appropriate for, and amenable to, discharge at this time. Pt verbalized understanding and agrees with care plan. Outpatient follow-up and return precautions given.         Wynetta Emery, PA-C 09/28/12 [phone]

## 2012-09-27 NOTE — ED Notes (Signed)
Pt walked to d/c window. 

## 2012-09-27 NOTE — ED Notes (Signed)
Pt reports being seen here for same last week.  Pt reports he was given referral to a dentist, has called and they have not returned his call.  Pt reports dental pain.

## 2012-09-29 NOTE — ED Provider Notes (Signed)
Medical screening examination/treatment/procedure(s) were performed by non-physician practitioner and as supervising physician I was immediately available for consultation/collaboration.  Doug Sou, MD 09/29/12 2007

## 2012-10-05 ENCOUNTER — Encounter (HOSPITAL_COMMUNITY): Payer: Self-pay | Admitting: Emergency Medicine

## 2012-10-05 ENCOUNTER — Emergency Department (HOSPITAL_COMMUNITY)
Admission: EM | Admit: 2012-10-05 | Discharge: 2012-10-05 | Disposition: A | Discharge status: Home / Self Care | Payer: Self-pay | Attending: Emergency Medicine | Admitting: Emergency Medicine

## 2012-10-05 ENCOUNTER — Emergency Department (HOSPITAL_COMMUNITY): Payer: Self-pay

## 2012-10-05 DIAGNOSIS — Y92009 Unspecified place in unspecified non-institutional (private) residence as the place of occurrence of the external cause: Secondary | ICD-10-CM | POA: Insufficient documentation

## 2012-10-05 DIAGNOSIS — S99929A Unspecified injury of unspecified foot, initial encounter: Secondary | ICD-10-CM | POA: Insufficient documentation

## 2012-10-05 DIAGNOSIS — Z9889 Other specified postprocedural states: Secondary | ICD-10-CM | POA: Insufficient documentation

## 2012-10-05 DIAGNOSIS — M25561 Pain in right knee: Secondary | ICD-10-CM

## 2012-10-05 DIAGNOSIS — W172XXA Fall into hole, initial encounter: Secondary | ICD-10-CM | POA: Insufficient documentation

## 2012-10-05 DIAGNOSIS — S8990XA Unspecified injury of unspecified lower leg, initial encounter: Secondary | ICD-10-CM | POA: Insufficient documentation

## 2012-10-05 DIAGNOSIS — I10 Essential (primary) hypertension: Secondary | ICD-10-CM | POA: Insufficient documentation

## 2012-10-05 DIAGNOSIS — Y939 Activity, unspecified: Secondary | ICD-10-CM | POA: Insufficient documentation

## 2012-10-05 DIAGNOSIS — F17200 Nicotine dependence, unspecified, uncomplicated: Secondary | ICD-10-CM | POA: Insufficient documentation

## 2012-10-05 MED ORDER — HYDROCODONE-ACETAMINOPHEN 5-325 MG PO TABS: 1.0000 | ORAL_TABLET | Freq: Four times a day (QID) | ORAL | Status: DC | PRN

## 2012-10-05 MED ORDER — HYDROCODONE-ACETAMINOPHEN 5-325 MG PO TABS
2.0000 | ORAL_TABLET | Freq: Once | ORAL | Status: AC
  Administered 2012-10-05: 2 via ORAL
  Filled 2012-10-05: qty 2

## 2012-10-05 MED ORDER — IBUPROFEN 800 MG PO TABS: 800.0000 mg | ORAL_TABLET | Freq: Three times a day (TID) | ORAL | Status: DC

## 2012-10-05 NOTE — Progress Notes (Signed)
Orthopedic Tech Progress Note Patient Details:  Aaron Roberson 1971-04-23 [id number]  Ortho Devices Type of Ortho Device: Crutches;Knee Sleeve Ortho Device/Splint Location: right knee Ortho Device/Splint Interventions: Application   Aaron Roberson 10/05/2012, 5:13 PM

## 2012-10-05 NOTE — ED Notes (Signed)
PA at bedside.

## 2012-10-05 NOTE — ED Notes (Signed)
Patient states he hurt his leg last week and had come in to be seen then.  Patient states that he stepped in a mole hole last night, and "I know I broke it, cause I heard it pop".

## 2012-10-05 NOTE — ED Notes (Signed)
Called twice and no answer.

## 2012-10-05 NOTE — ED Provider Notes (Signed)
History    This chart was scribed for non-physician practitioner,Denell Cothern Cecilio Asper   working with Vida Roller, MD by Donne Anon, ED Scribe. This patient was seen in room TR11C/TR11C and the patient's care was started at 1506.    CSN: [id number]  Arrival date & time 10/05/12  1449   First MD Initiated Contact with Patient 10/05/12 1506      Chief Complaint  Patient presents with  . Knee Pain     The history is provided by the patient. No language interpreter was used.   HPI Comments: Aaron Roberson is a 42 y.o. male who presents to the Emergency Department complaining of gradual onset, gradually worsening, chronic, moderate right knee pain that was exacerbated last night when he stepped in a mole hole in his yard. He states he hears popping and clipping when he walks. He reports associated swelling. He has tried Naproxen, Aspirin, Oxycodone and a knee brace with mild relief. He denies any other pain. He was seen in the ED last week for the same complaint. He reports he has had 3 surgeries on it previously, with the last one being in 1991.   Past Medical History  Diagnosis Date  . Hypertension     Past Surgical History  Procedure Laterality Date  . Knee surgery      x 3    Family History  Problem Relation Age of Onset  . COPD Mother   . Stroke Mother   . Hypertension Mother   . Cancer Father     prostate  . Hypertension Father   . Arthritis Maternal Grandfather   . Cancer Paternal Grandmother     breast    History  Substance Use Topics  . Smoking status: Current Every Day Smoker -- 1.00 packs/day    Types: Cigarettes  . Smokeless tobacco: Not on file  . Alcohol Use: No      Review of Systems  Musculoskeletal: Positive for joint swelling and arthralgias.  All other systems reviewed and are negative.    Allergies  Review of patient's allergies indicates no known allergies.  Home Medications   Current Outpatient Rx  Name  Route  Sig  Dispense   Refill  . amoxicillin (AMOXIL) 500 MG capsule   Oral   Take 500 mg by mouth 3 (three) times daily. 7 day treatment. Started 09/18/2012 evening.           BP 116/87  Pulse 87  Temp(Src) 97.8 F (36.6 C) (Oral)  Resp 18  Ht 6' (1.829 m)  Wt 159 lb (72.122 kg)  BMI 21.56 kg/m2  SpO2 94%  Physical Exam  Nursing note and vitals reviewed. Constitutional: He is oriented to person, place, and time. He appears well-developed and well-nourished. No distress.  HENT:  Head: Normocephalic and atraumatic.  Eyes: EOM are normal.  Neck: Neck supple. No tracheal deviation present.  Cardiovascular: Normal rate.   Pulmonary/Chest: Effort normal. No respiratory distress.  Musculoskeletal: Normal range of motion.  Right knee mildly swollen. Mild crepitance noted. Joint stability test deferred secondary to pain, no bony tenderness  Neurological: He is alert and oriented to person, place, and time.  Skin: Skin is warm and dry.  Psychiatric: He has a normal mood and affect. His behavior is normal.    ED Course  Procedures (including critical care time) DIAGNOSTIC STUDIES: Oxygen Saturation is 94% on room air, normal by my interpretation.    COORDINATION OF CARE: 3:57 PM Discussed treatment plan which includes  knee xray with pt at bedside and pt agreed to plan.     Results for orders placed in visit on 09/08/10  CBC WITH DIFFERENTIAL      Result Value Range   WBC 9.4  4.5 - 10.5 K/uL   RBC 4.53  4.22 - 5.81 Mil/uL   Hemoglobin 14.7  13.0 - 17.0 g/dL   HCT 95.6  21.3 - 08.6 %   MCV 92.5  78.0 - 100.0 fl   MCHC 35.0  30.0 - 36.0 g/dL   RDW 57.8  46.9 - 62.9 %   Platelets 232.0  150.0 - 400.0 K/uL   Neutrophils Relative % 55.3  43.0 - 77.0 %   Lymphocytes Relative 31.5  12.0 - 46.0 %   Monocytes Relative 8.9  3.0 - 12.0 %   Eosinophils Relative 3.6  0.0 - 5.0 %   Basophils Relative 0.7  0.0 - 3.0 %   Neutro Abs 5.2  1.4 - 7.7 K/uL   Lymphs Abs 3.0  0.7 - 4.0 K/uL   Monocytes Absolute  0.8  0.1 - 1.0 K/uL   Eosinophils Absolute 0.3  0.0 - 0.7 K/uL   Basophils Absolute 0.1  0.0 - 0.1 K/uL  BASIC METABOLIC PANEL      Result Value Range   Sodium 141  135 - 145 mEq/L   Potassium 4.1  3.5 - 5.1 mEq/L   Chloride 101  96 - 112 mEq/L   CO2 27  19 - 32 mEq/L   Glucose, Bld 76  70 - 99 mg/dL   BUN 13  6 - 23 mg/dL   Creatinine, Ser 0.9  0.4 - 1.5 mg/dL   Calcium 8.8  8.4 - 52.8 mg/dL   GFR 413.24  >40.10 mL/min  TSH      Result Value Range   TSH 1.97  0.35 - 5.50 uIU/mL  HEPATIC FUNCTION PANEL      Result Value Range   Total Bilirubin 0.5  0.3 - 1.2 mg/dL   Bilirubin, Direct 0.1  0.0 - 0.3 mg/dL   Alkaline Phosphatase 43  39 - 117 U/L   AST 17  0 - 37 U/L   ALT 17  0 - 53 U/L   Total Protein 6.5  6.0 - 8.3 g/dL   Albumin 4.1  3.5 - 5.2 g/dL   Dg Knee Complete 4 Views Right  10/05/2012   *RADIOLOGY REPORT*  Clinical Data: Fall  RIGHT KNEE - COMPLETE 4+ VIEW  Comparison: 03/30/2012  Findings: There is no joint effusion identified.  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 Chondrocalcinosis is present.  No fracture or subluxation noted.  No radiopaque foreign bodies or soft tissue calcifications.  IMPRESSION:  1.  Mild osteoarthritis and chondrocalcinosis.   Original Report Authenticated By: Signa Kell, M.D.      1. Knee pain, right       MDM  Patient with chronic right knee pain. Recently worsened when he "stepped and a mole hole last night." Plain films are negative. Suspect meniscal injury, as he has had clicking and popping. Will give a knee sleeve, crutches, and some pain medicine.    I personally performed the services described in this documentation, which was scribed in my presence. The recorded information has been reviewed and is accurate.      Roxy Horseman, PA-C 10/06/12 [phone]

## 2012-10-06 NOTE — ED Provider Notes (Signed)
Medical screening examination/treatment/procedure(s) were performed by non-physician practitioner and as supervising physician I was immediately available for consultation/collaboration.    Robynne Roat D Kaydince Towles, MD 10/06/12 1358 

## 2012-11-23 ENCOUNTER — Emergency Department (HOSPITAL_COMMUNITY)
Admission: EM | Admit: 2012-11-23 | Discharge: 2012-11-23 | Disposition: A | Discharge status: Home / Self Care | Payer: Self-pay | Attending: Emergency Medicine | Admitting: Emergency Medicine

## 2012-11-23 ENCOUNTER — Encounter (HOSPITAL_COMMUNITY): Payer: Self-pay | Admitting: Emergency Medicine

## 2012-11-23 DIAGNOSIS — K0889 Other specified disorders of teeth and supporting structures: Secondary | ICD-10-CM

## 2012-11-23 DIAGNOSIS — F17200 Nicotine dependence, unspecified, uncomplicated: Secondary | ICD-10-CM | POA: Insufficient documentation

## 2012-11-23 DIAGNOSIS — I10 Essential (primary) hypertension: Secondary | ICD-10-CM | POA: Insufficient documentation

## 2012-11-23 DIAGNOSIS — K089 Disorder of teeth and supporting structures, unspecified: Secondary | ICD-10-CM | POA: Insufficient documentation

## 2012-11-23 MED ORDER — ZIPRASIDONE MESYLATE 20 MG IM SOLR: 10.0000 mg | Freq: Once | INTRAMUSCULAR | Status: DC

## 2012-11-23 MED ORDER — OXYCODONE-ACETAMINOPHEN 5-325 MG PO TABS
1.0000 | ORAL_TABLET | Freq: Once | ORAL | Status: AC
  Administered 2012-11-23: 1 via ORAL
  Filled 2012-11-23: qty 1

## 2012-11-23 MED ORDER — AMOXICILLIN 500 MG PO CAPS
500.0000 mg | ORAL_CAPSULE | Freq: Once | ORAL | Status: AC
  Administered 2012-11-23: 500 mg via ORAL
  Filled 2012-11-23: qty 1

## 2012-11-23 MED ORDER — AMOXICILLIN 500 MG PO CAPS: 500.0000 mg | ORAL_CAPSULE | Freq: Three times a day (TID) | ORAL | Status: DC

## 2012-11-23 MED ORDER — OXYCODONE-ACETAMINOPHEN 5-325 MG PO TABS: ORAL_TABLET | ORAL | Status: DC

## 2012-11-23 NOTE — Progress Notes (Signed)
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 Pt voiced being very pleased with CM visit and resources offered  CMconfirms self pay [facility] resident with no pcp. CM discussed and provided written information for self pay pcps, importance of pcp for f/u care, www.needymeds.org, discounted pharmacies and other [place] resources such as financial assistance, DSS and  health department  Reviewed resources for [employer] [place] self pay pcps like [facility], family medicine at [address], [facility] family practice, general medical clinics, [facility] urgent care plus others, [facility] out patient pharmacies and housing Pt voiced understanding and appreciation of resources provided Pt reports being familiar with [facility] and [facility] Went to [facility] but found out it provides majority of services for homeless members and he reports he is not homeless  Pt also provided with a [facility] flyer reviewing the orange card program Pt welcomes a call or letter from [facility] Pt reports attempt to get services at DSS but reports he "did not qualify" for any services  Pt reports he may be receiving a new job soon that may offer insurance but may be 6 months before insurance provided CM reviewed Affordable care act and provided resources on how to enroll

## 2012-11-23 NOTE — ED Provider Notes (Signed)
History    CSN: [id number] Arrival date & time 11/23/12  1478  First MD Initiated Contact with Patient 11/23/12 1002     Chief Complaint  Patient presents with  . Dental Pain   (Consider location/radiation/quality/duration/timing/severity/associated sxs/prior Treatment) HPI  Aaron Roberson is a 42 y.o. male complaining of exacerbation of chronic dental pain to left upper and lower teeth. Patient denies fever, nausea vomiting, facial or gum swelling, difficulty swallowing his secretions. He's been taking over-the-counter pain medication at home with little relief. Patient rates his pain as 9/10, is exacerbated by chewing and movement.  Past Medical History  Diagnosis Date  . Hypertension    Past Surgical History  Procedure Laterality Date  . Knee surgery      x 3  . Cyst removals     Family History  Problem Relation Age of Onset  . COPD Mother   . Stroke Mother   . Hypertension Mother   . Cancer Father     prostate  . Hypertension Father   . Arthritis Maternal Grandfather   . Cancer Paternal Grandmother     breast   History  Substance Use Topics  . Smoking status: Current Every Day Smoker -- 0.50 packs/day    Types: Cigarettes  . Smokeless tobacco: Not on file  . Alcohol Use: Yes    Review of Systems  Constitutional: Negative for fever, chills, activity change and appetite change.  HENT: Positive for dental problem. Negative for sore throat, drooling, mouth sores and trouble swallowing.   Respiratory: Negative for shortness of breath.   Cardiovascular: Negative for chest pain.  Gastrointestinal: Negative for nausea, vomiting, diarrhea and constipation.  Musculoskeletal: Negative for myalgias.  Skin: Negative for rash.  All other systems reviewed and are negative.    Allergies  Review of patient's allergies indicates no known allergies.  Home Medications   Current Outpatient Rx  Name  Route  Sig  Dispense  Refill  . ibuprofen (ADVIL,MOTRIN) 800 MG  tablet   Oral   Take 1 tablet (800 mg total) by mouth 3 (three) times daily.   21 tablet   0    BP 121/90  Pulse 84  Temp(Src) 98.4 F (36.9 C) (Oral)  Resp 16  SpO2 100% Physical Exam  Nursing note and vitals reviewed. Constitutional: He is oriented to person, place, and time. He appears well-developed and well-nourished. No distress.  HENT:  Head: Normocephalic.  Mouth/Throat: Oropharynx is clear and moist.  Generally poor dentition, no gingival swelling, erythema or tenderness to palpation. Patient is handling their secretions. There is no tenderness to palpation or firmness underneath tongue bilaterally. No trismus.   Eyes: Conjunctivae and EOM are normal.  Cardiovascular: Normal rate.   Pulmonary/Chest: Effort normal. No stridor.  Abdominal: Soft.  Musculoskeletal: Normal range of motion.  Neurological: He is alert and oriented to person, place, and time.  Psychiatric: He has a normal mood and affect.    ED Course  Procedures (including critical care time) Labs Reviewed - No data to display No results found. 1. Pain, dental     MDM   Filed Vitals:   11/23/12 0945  BP: 121/90  Pulse: 84  Temp: 98.4 F (36.9 C)  TempSrc: Oral  Resp: 16  SpO2: 100%     Aaron Roberson is a 42 y.o. male Patient with toothache.  No gross abscess.  Exam unconcerning for Ludwig's angina or spread of infection.  Will treat with penicillin and pain medicine.  Urged patient to  follow-up with dentist.     Medications  oxyCODONE-acetaminophen (PERCOCET/ROXICET) 5-325 MG per tablet 1 tablet (1 tablet Oral Given 11/23/12 1047)  amoxicillin (AMOXIL) capsule 500 mg (500 mg Oral Given 11/23/12 1047)    Pt is hemodynamically stable, appropriate for, and amenable to discharge at this time. Pt verbalized understanding and agrees with care plan. Outpatient follow-up and specific return precautions discussed.    Discharge Medication List as of 11/23/2012 10:25 AM    START taking these  medications   Details  amoxicillin (AMOXIL) 500 MG capsule Take 1 capsule (500 mg total) by mouth 3 (three) times daily., Starting 11/23/2012, Until Discontinued, Print    oxyCODONE-acetaminophen (PERCOCET/ROXICET) 5-325 MG per tablet 1 to 2 tabs PO q6hrs  PRN for pain, Print         Wynetta Emery, PA-C 11/23/12 1151

## 2012-11-23 NOTE — ED Provider Notes (Signed)
Medical screening examination/treatment/procedure(s) were performed by non-physician practitioner and as supervising physician I was immediately available for consultation/collaboration.  Earon Rivest R. Jaze Rodino, MD 11/23/12 1509 

## 2012-11-23 NOTE — ED Notes (Signed)
Pt states that he has had a hx of dental problems.  Has had teeth pulled.  C/o rt sided dental pain x "a long time".  States it has been about a month.

## 2012-11-23 NOTE — Progress Notes (Signed)
[facility] CL did not get to see pt but will be sending him information about the Orange Card, using the address provided. °

## 2013-03-10 ENCOUNTER — Emergency Department (HOSPITAL_COMMUNITY)
Admission: EM | Admit: 2013-03-10 | Discharge: 2013-03-10 | Disposition: A | Discharge status: Home / Self Care | Payer: Self-pay | Attending: Emergency Medicine | Admitting: Emergency Medicine

## 2013-03-10 DIAGNOSIS — I10 Essential (primary) hypertension: Secondary | ICD-10-CM | POA: Insufficient documentation

## 2013-03-10 DIAGNOSIS — F17200 Nicotine dependence, unspecified, uncomplicated: Secondary | ICD-10-CM | POA: Insufficient documentation

## 2013-03-10 DIAGNOSIS — K029 Dental caries, unspecified: Secondary | ICD-10-CM | POA: Insufficient documentation

## 2013-03-10 DIAGNOSIS — K089 Disorder of teeth and supporting structures, unspecified: Secondary | ICD-10-CM | POA: Insufficient documentation

## 2013-03-10 DIAGNOSIS — K0889 Other specified disorders of teeth and supporting structures: Secondary | ICD-10-CM

## 2013-03-10 DIAGNOSIS — K0381 Cracked tooth: Secondary | ICD-10-CM | POA: Insufficient documentation

## 2013-03-10 MED ORDER — TRAMADOL HCL 50 MG PO TABS: 50.0000 mg | ORAL_TABLET | Freq: Four times a day (QID) | ORAL | Status: DC | PRN

## 2013-03-10 MED ORDER — PENICILLIN V POTASSIUM 500 MG PO TABS
500.0000 mg | ORAL_TABLET | Freq: Once | ORAL | Status: AC
  Administered 2013-03-10: 500 mg via ORAL
  Filled 2013-03-10: qty 1

## 2013-03-10 MED ORDER — TRAMADOL HCL 50 MG PO TABS
50.0000 mg | ORAL_TABLET | Freq: Once | ORAL | Status: AC
  Administered 2013-03-10: 50 mg via ORAL
  Filled 2013-03-10: qty 1

## 2013-03-10 MED ORDER — PENICILLIN V POTASSIUM 500 MG PO TABS: 500.0000 mg | ORAL_TABLET | Freq: Four times a day (QID) | ORAL | Status: DC

## 2013-03-10 NOTE — ED Provider Notes (Signed)
CSN: [id number]     Arrival date & time 03/10/13  1848 History  This chart was scribed for non-physician practitioner Emilia Beck, PA-C working with Toy Baker, MD by Leone Payor, ED Scribe. This patient was seen in room WTR6/WTR6 and the patient's care was started at [employer].    No chief complaint on file.   The history is provided by the patient. No language interpreter was used.    HPI Comments: Aaron Roberson is a 42 y.o. male who presents to the Emergency Department complaining of sudden onset, constant, gradually worsening right lower dental pain that began 2-3 days ago. Pt states he initially chipped his tooth and it has been gradually chipping away with increasing pain. He denies fever, chills.   Past Medical History  Diagnosis Date  . Hypertension    Past Surgical History  Procedure Laterality Date  . Knee surgery      x 3  . Cyst removals     Family History  Problem Relation Age of Onset  . COPD Mother   . Stroke Mother   . Hypertension Mother   . Cancer Father     prostate  . Hypertension Father   . Arthritis Maternal Grandfather   . Cancer Paternal Grandmother     breast   History  Substance Use Topics  . Smoking status: Current Every Day Smoker -- 0.50 packs/day    Types: Cigarettes  . Smokeless tobacco: Not on file  . Alcohol Use: Yes    Review of Systems  Constitutional: Negative for fever and chills.  HENT: Positive for dental problem.   All other systems reviewed and are negative.    Allergies  Review of patient's allergies indicates no known allergies.  Home Medications   Current Outpatient Rx  Name  Route  Sig  Dispense  Refill  . acetaminophen (TYLENOL) 325 MG tablet   Oral   Take 325 mg by mouth every 6 (six) hours as needed for pain (pain).          There were no vitals taken for this visit. Physical Exam  Nursing note and vitals reviewed. Constitutional: He is oriented to person, place, and time. He appears  well-developed and well-nourished. No distress.  HENT:  Head: Normocephalic and atraumatic.  Mouth/Throat: Uvula is midline, oropharynx is clear and moist and mucous membranes are normal.  Poor dentition. Multiple cracked and decayed teeth. Right upper and right lower teeth tender to percussion.   Eyes: Conjunctivae and EOM are normal.  Neck: Normal range of motion.  Pulmonary/Chest: Effort normal.  Musculoskeletal: Normal range of motion.  Neurological: He is alert and oriented to person, place, and time.  Skin: Skin is warm and dry. No rash noted. He is not diaphoretic.  Psychiatric: He has a normal mood and affect. His behavior is normal.    ED Course  Procedures     COORDINATION OF CARE: 7:32 PM Discussed treatment plan with pt at bedside and pt agreed to plan.   Labs Review Labs Reviewed - No data to display Imaging Review No results found.  EKG Interpretation   None       MDM   1. Pain, dental     7:39 PM Patient will have Tramadol and Veetid for dental pain. Vitals stable and patient afebrile. Patient instructed to follow up with dental clinic on resource guide and free clinic in [place] in November.   I personally performed the services described in this documentation, which was scribed in  my presence. The recorded information has been reviewed and is accurate.   Emilia Beck, PA-C 03/10/13 1940

## 2013-03-10 NOTE — ED Notes (Signed)
Pt c/o dental pain to R side of mouth in lower back teeth. Pt states he has some broken teeth there and the area is getting worse.

## 2013-03-11 NOTE — ED Provider Notes (Signed)
Medical screening examination/treatment/procedure(s) were performed by non-physician practitioner and as supervising physician I was immediately available for consultation/collaboration.  Toy Baker, MD 03/11/13 [phone]

## 2013-05-02 ENCOUNTER — Encounter (HOSPITAL_COMMUNITY): Payer: Self-pay | Admitting: Emergency Medicine

## 2013-05-02 ENCOUNTER — Emergency Department (HOSPITAL_COMMUNITY): Payer: No Typology Code available for payment source

## 2013-05-02 ENCOUNTER — Emergency Department (HOSPITAL_COMMUNITY)
Admission: EM | Admit: 2013-05-02 | Discharge: 2013-05-02 | Disposition: A | Discharge status: Home / Self Care | Payer: No Typology Code available for payment source | Attending: Emergency Medicine | Admitting: Emergency Medicine

## 2013-05-02 DIAGNOSIS — E876 Hypokalemia: Secondary | ICD-10-CM

## 2013-05-02 DIAGNOSIS — Y9389 Activity, other specified: Secondary | ICD-10-CM | POA: Insufficient documentation

## 2013-05-02 DIAGNOSIS — R109 Unspecified abdominal pain: Secondary | ICD-10-CM

## 2013-05-02 DIAGNOSIS — S0993XA Unspecified injury of face, initial encounter: Secondary | ICD-10-CM | POA: Insufficient documentation

## 2013-05-02 DIAGNOSIS — M25561 Pain in right knee: Secondary | ICD-10-CM

## 2013-05-02 DIAGNOSIS — S3981XA Other specified injuries of abdomen, initial encounter: Secondary | ICD-10-CM | POA: Insufficient documentation

## 2013-05-02 DIAGNOSIS — Y92410 Unspecified street and highway as the place of occurrence of the external cause: Secondary | ICD-10-CM | POA: Insufficient documentation

## 2013-05-02 DIAGNOSIS — IMO0002 Reserved for concepts with insufficient information to code with codable children: Secondary | ICD-10-CM | POA: Insufficient documentation

## 2013-05-02 DIAGNOSIS — S298XXA Other specified injuries of thorax, initial encounter: Secondary | ICD-10-CM | POA: Insufficient documentation

## 2013-05-02 DIAGNOSIS — F17200 Nicotine dependence, unspecified, uncomplicated: Secondary | ICD-10-CM | POA: Insufficient documentation

## 2013-05-02 DIAGNOSIS — I10 Essential (primary) hypertension: Secondary | ICD-10-CM | POA: Insufficient documentation

## 2013-05-02 LAB — URINALYSIS, ROUTINE W REFLEX MICROSCOPIC
Glucose, UA: NEGATIVE mg/dL
Ketones, ur: NEGATIVE mg/dL
Leukocytes, UA: NEGATIVE
Nitrite: NEGATIVE
Protein, ur: NEGATIVE mg/dL
Urobilinogen, UA: 0.2 mg/dL (ref 0.0–1.0)

## 2013-05-02 LAB — COMPREHENSIVE METABOLIC PANEL
Albumin: 4.2 g/dL (ref 3.5–5.2)
Alkaline Phosphatase: 54 U/L (ref 39–117)
BUN: 10 mg/dL (ref 6–23)
Chloride: 103 mEq/L (ref 96–112)
Creatinine, Ser: 0.8 mg/dL (ref 0.50–1.35)
GFR calc Af Amer: >90 mL/min (ref >90)
GFR calc non Af Amer: >90 mL/min (ref >90)
Glucose, Bld: 115 mg/dL — ABNORMAL HIGH (ref 70–99)
Total Bilirubin: 0.2 mg/dL — ABNORMAL LOW (ref 0.3–1.2)

## 2013-05-02 LAB — CBC WITH DIFFERENTIAL/PLATELET
Basophils Relative: 1 % (ref 0–1)
Eosinophils Absolute: 0.4 10*3/uL (ref 0.0–0.7)
HCT: 40 % (ref 39.0–52.0)
Hemoglobin: 13.6 g/dL (ref 13.0–17.0)
Lymphs Abs: 2.2 10*3/uL (ref 0.7–4.0)
MCH: 30.6 pg (ref 26.0–34.0)
MCHC: 34 g/dL (ref 30.0–36.0)
Monocytes Absolute: 1.1 10*3/uL — ABNORMAL HIGH (ref 0.1–1.0)
Monocytes Relative: 14 % — ABNORMAL HIGH (ref 3–12)
Neutro Abs: 4.3 10*3/uL (ref 1.7–7.7)
RBC: 4.44 MIL/uL (ref 4.22–5.81)

## 2013-05-02 LAB — RAPID URINE DRUG SCREEN, HOSP PERFORMED: Opiates: POSITIVE — AB

## 2013-05-02 MED ORDER — POTASSIUM CHLORIDE CRYS ER 20 MEQ PO TBCR
40.0000 meq | EXTENDED_RELEASE_TABLET | Freq: Once | ORAL | Status: AC
  Administered 2013-05-02: 40 meq via ORAL
  Filled 2013-05-02: qty 2

## 2013-05-02 MED ORDER — HYDROCODONE-ACETAMINOPHEN 5-325 MG PO TABS: 1.0000 | ORAL_TABLET | Freq: Four times a day (QID) | ORAL | Status: DC | PRN

## 2013-05-02 MED ORDER — SODIUM CHLORIDE 0.9 % IV SOLN
Freq: Once | INTRAVENOUS | Status: AC
  Administered 2013-05-02: 20:00:00 via INTRAVENOUS

## 2013-05-02 MED ORDER — HYDROMORPHONE HCL PF 1 MG/ML IJ SOLN
1.0000 mg | Freq: Once | INTRAMUSCULAR | Status: AC
  Administered 2013-05-02: 1 mg via INTRAVENOUS
  Filled 2013-05-02: qty 1

## 2013-05-02 MED ORDER — IOHEXOL 300 MG/ML  SOLN
100.0000 mL | Freq: Once | INTRAMUSCULAR | Status: AC | PRN
  Administered 2013-05-02: 100 mL via INTRAVENOUS

## 2013-05-02 MED ORDER — ONDANSETRON HCL 4 MG/2ML IJ SOLN
4.0000 mg | Freq: Once | INTRAMUSCULAR | Status: AC
  Administered 2013-05-02: 4 mg via INTRAVENOUS
  Filled 2013-05-02: qty 2
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
## 2013-05-02 NOTE — ED Provider Notes (Signed)
Medical screening examination/treatment/procedure(s) were performed by non-physician practitioner and as supervising physician I was immediately available for consultation/collaboration.  EKG Interpretation   None        Kolette Vey F Kashae Carstens, MD 05/02/13 2325 

## 2013-05-02 NOTE — ED Notes (Signed)
Pt c/o upper abdominal pain radiating across -from right to left.. C/o neck pain

## 2013-05-05 NOTE — ED Provider Notes (Signed)
Medical screening examination/treatment/procedure(s) were performed by non-physician practitioner and as supervising physician I was immediately available for consultation/collaboration.  EKG Interpretation   None         Dagmar Hait, MD 05/05/13 [phone]

## 2014-06-28 ENCOUNTER — Emergency Department (HOSPITAL_COMMUNITY)
Admission: EM | Admit: 2014-06-28 | Discharge: 2014-06-29 | Disposition: A | Discharge status: Home / Self Care | Payer: Self-pay | Attending: Emergency Medicine | Admitting: Emergency Medicine

## 2014-06-28 ENCOUNTER — Encounter (HOSPITAL_COMMUNITY): Payer: Self-pay | Admitting: Emergency Medicine

## 2014-06-28 DIAGNOSIS — I10 Essential (primary) hypertension: Secondary | ICD-10-CM | POA: Insufficient documentation

## 2014-06-28 DIAGNOSIS — L03114 Cellulitis of left upper limb: Secondary | ICD-10-CM | POA: Insufficient documentation

## 2014-06-28 DIAGNOSIS — Z720 Tobacco use: Secondary | ICD-10-CM | POA: Insufficient documentation

## 2014-06-28 DIAGNOSIS — Z7982 Long term (current) use of aspirin: Secondary | ICD-10-CM | POA: Insufficient documentation

## 2014-06-28 MED ORDER — CLINDAMYCIN HCL 300 MG PO CAPS
450.0000 mg | ORAL_CAPSULE | Freq: Once | ORAL | Status: AC
  Administered 2014-06-28: 450 mg via ORAL
  Filled 2014-06-28: qty 1

## 2014-06-28 MED ORDER — IBUPROFEN 200 MG PO TABS
600.0000 mg | ORAL_TABLET | Freq: Once | ORAL | Status: AC
  Administered 2014-06-28: 600 mg via ORAL
  Filled 2014-06-28: qty 3

## 2014-06-28 NOTE — ED Provider Notes (Signed)
CSN: [id number]     Arrival date & time 06/28/14  2125 History   First MD Initiated Contact with Patient 06/28/14 2223     Chief Complaint  Patient presents with  . Cellulitis     (Consider location/radiation/quality/duration/timing/severity/associated sxs/prior Treatment) Patient is a 44 y.o. male presenting with rash.  Rash Location: left arm. Quality: painful and redness   Quality: not draining   Pain details:    Quality:  Hot   Severity:  Severe   Onset quality:  Gradual   Duration:  2 days   Timing:  Constant   Progression:  Worsening Severity:  Severe Onset quality:  Gradual Timing:  Constant Progression:  Worsening Chronicity:  New Context comment:  What asked about IV drug use "well, I've used them before, but not recently". Relieved by:  Nothing Worsened by:  Contact Associated symptoms: no fever     Past Medical History  Diagnosis Date  . Hypertension    Past Surgical History  Procedure Laterality Date  . Knee surgery      x 3  . Cyst removals     Family History  Problem Relation Age of Onset  . COPD Mother   . Stroke Mother   . Hypertension Mother   . Cancer Father     prostate  . Hypertension Father   . Arthritis Maternal Grandfather   . Cancer Paternal Grandmother     breast   History  Substance Use Topics  . Smoking status: Current Every Day Smoker -- 0.50 packs/day    Types: Cigarettes  . Smokeless tobacco: Not on file  . Alcohol Use: Yes    Review of Systems  Constitutional: Negative for fever.  Skin: Positive for rash.  All other systems reviewed and are negative.     Allergies  Review of patient's allergies indicates no known allergies.  Home Medications   Prior to Admission medications   Medication Sig Start Date End Date Taking? Authorizing Provider  acetaminophen (TYLENOL) 325 MG tablet Take 650 mg by mouth every 6 (six) hours as needed for pain (pain).    Yes Historical Provider, MD  aspirin 325 MG tablet Take 325 mg  by mouth every 4 (four) hours as needed for mild pain.   Yes Historical Provider, MD  doxylamine, Sleep, (UNISOM) 25 MG tablet Take 25 mg by mouth at bedtime as needed.   Yes Historical Provider, MD  ibuprofen (ADVIL,MOTRIN) 200 MG tablet Take 600 mg by mouth every 6 (six) hours as needed for moderate pain.   Yes Historical Provider, MD  HYDROcodone-acetaminophen (NORCO/VICODIN) 5-325 MG per tablet Take 1-2 tablets by mouth every 6 (six) hours as needed. Patient not taking: Reported on 06/28/2014 05/02/13   Jillyn LedgerJessica K Palmer, PA-C   BP 150/107 mmHg  Pulse 103  Temp(Src) 98.7 F (37.1 C) (Oral)  Resp 20  SpO2 100% Physical Exam  Constitutional: He is oriented to person, place, and time. He appears well-developed and well-nourished. No distress.  Appears in pain in his left arm  HENT:  Head: Normocephalic and atraumatic.  Eyes: Conjunctivae are normal. No scleral icterus.  Neck: Neck supple.  Cardiovascular: Normal rate and intact distal pulses.   Pulmonary/Chest: Effort normal. No stridor. No respiratory distress.  Abdominal: Normal appearance. He exhibits no distension.  Neurological: He is alert and oriented to person, place, and time.  Skin: Skin is warm and dry. No rash noted.  Large area of induration and erythema centered around his AC fossa.  No palpable  masses.  Tender to palpation.  NV intact distally.  Psychiatric: He has a normal mood and affect. His behavior is normal.  Nursing note and vitals reviewed.   ED Course  Procedures (including critical care time)  EMERGENCY DEPARTMENT US SOFT TISSUE INTERPRETATION "Study: Limited Soft Tissue Ultrasound"  INDICATIONS: Pain and Soft tissue infection Multiple views of the body part were obtained in real-time with a multi-frequency linear probe PERFORMED BY:  Myself IMAGES ARCHIVED?: Yes SIDE:Left BODY PART:Upper extremity FINDINGS: No abcess noted and Cellulitis present INTERPRETATION:  No abcess noted and Cellulitis  present     Labs Review Labs Reviewed - No data to display  Imaging Review No results found.   EKG Interpretation None      MDM   Final diagnoses:  Left arm cellulitis    Pt has cellulitis to his left forearm.  He is nontoxic and has stable vitals.  Bedside ultrasound used to rule out abscess.  Given dose of clinda and border has been marked.  Due to extent of cellulitis, I advised him to see him PCP or come back to the ED tomorrow for a recheck.      Candyce Churn III, MD 06/29/14 [phone]

## 2014-06-28 NOTE — ED Notes (Signed)
Radial pulses are palpable to pt's left arm

## 2014-06-28 NOTE — ED Notes (Signed)
Pt states he started having pain in his left arm yesterday but today the pain has gotten worse and over the past few hours it has started to swell and become red  Pt has swelling and redness noted at the elbow area that extends up to his upper arm and down to his lower arm

## 2014-06-29 MED ORDER — CLINDAMYCIN HCL 150 MG PO CAPS: 450.0000 mg | ORAL_CAPSULE | Freq: Three times a day (TID) | ORAL | Status: DC

## 2014-06-29 NOTE — Discharge Instructions (Signed)

## 2014-06-30 ENCOUNTER — Encounter (HOSPITAL_COMMUNITY): Payer: Self-pay | Admitting: Nurse Practitioner

## 2014-06-30 ENCOUNTER — Inpatient Hospital Stay (HOSPITAL_COMMUNITY): Payer: Self-pay

## 2014-06-30 ENCOUNTER — Inpatient Hospital Stay (HOSPITAL_COMMUNITY)
Admission: EM | Admit: 2014-06-30 | Discharge: 2014-07-03 | DRG: 872 | Disposition: A | Discharge status: Home / Self Care | Payer: Self-pay | Attending: Internal Medicine | Admitting: Internal Medicine

## 2014-06-30 DIAGNOSIS — I10 Essential (primary) hypertension: Secondary | ICD-10-CM | POA: Diagnosis present

## 2014-06-30 DIAGNOSIS — R3911 Hesitancy of micturition: Secondary | ICD-10-CM | POA: Diagnosis present

## 2014-06-30 DIAGNOSIS — L03114 Cellulitis of left upper limb: Secondary | ICD-10-CM | POA: Diagnosis present

## 2014-06-30 DIAGNOSIS — R300 Dysuria: Secondary | ICD-10-CM | POA: Diagnosis present

## 2014-06-30 DIAGNOSIS — E876 Hypokalemia: Secondary | ICD-10-CM | POA: Diagnosis present

## 2014-06-30 DIAGNOSIS — L0291 Cutaneous abscess, unspecified: Secondary | ICD-10-CM | POA: Diagnosis present

## 2014-06-30 DIAGNOSIS — Z720 Tobacco use: Secondary | ICD-10-CM | POA: Diagnosis present

## 2014-06-30 DIAGNOSIS — L02414 Cutaneous abscess of left upper limb: Secondary | ICD-10-CM | POA: Diagnosis present

## 2014-06-30 DIAGNOSIS — A419 Sepsis, unspecified organism: Principal | ICD-10-CM | POA: Diagnosis present

## 2014-06-30 DIAGNOSIS — R059 Cough, unspecified: Secondary | ICD-10-CM

## 2014-06-30 DIAGNOSIS — L02419 Cutaneous abscess of limb, unspecified: Secondary | ICD-10-CM

## 2014-06-30 DIAGNOSIS — Z9114 Patient's other noncompliance with medication regimen: Secondary | ICD-10-CM | POA: Diagnosis present

## 2014-06-30 DIAGNOSIS — Z79891 Long term (current) use of opiate analgesic: Secondary | ICD-10-CM

## 2014-06-30 DIAGNOSIS — R05 Cough: Secondary | ICD-10-CM

## 2014-06-30 DIAGNOSIS — L0390 Cellulitis, unspecified: Secondary | ICD-10-CM

## 2014-06-30 DIAGNOSIS — Z7982 Long term (current) use of aspirin: Secondary | ICD-10-CM

## 2014-06-30 DIAGNOSIS — F17210 Nicotine dependence, cigarettes, uncomplicated: Secondary | ICD-10-CM | POA: Diagnosis present

## 2014-06-30 LAB — CBC
HCT: 42.3 % (ref 39.0–52.0)
HEMOGLOBIN: 14.2 g/dL (ref 13.0–17.0)
MCH: 30.3 pg (ref 26.0–34.0)
MCHC: 33.6 g/dL (ref 30.0–36.0)
MCV: 90.4 fL (ref 78.0–100.0)
PLATELETS: 212 10*3/uL (ref 150–400)
RBC: 4.68 MIL/uL (ref 4.22–5.81)
RDW: 14.4 % (ref 11.5–15.5)
WBC: 13 10*3/uL — ABNORMAL HIGH (ref 4.0–10.5)

## 2014-06-30 LAB — URINALYSIS, ROUTINE W REFLEX MICROSCOPIC
Bilirubin Urine: NEGATIVE
Glucose, UA: NEGATIVE mg/dL
Hgb urine dipstick: NEGATIVE
KETONES UR: NEGATIVE mg/dL
Leukocytes, UA: NEGATIVE
NITRITE: NEGATIVE
PH: 5.5 (ref 5.0–8.0)
PROTEIN: NEGATIVE mg/dL
SPECIFIC GRAVITY, URINE: 1.02 (ref 1.005–1.030)
Urobilinogen, UA: 1 mg/dL (ref 0.0–1.0)

## 2014-06-30 LAB — CBC WITH DIFFERENTIAL/PLATELET
BASOS PCT: 0 % (ref 0–1)
Basophils Absolute: 0 10*3/uL (ref 0.0–0.1)
Eosinophils Absolute: 0.1 10*3/uL (ref 0.0–0.7)
Eosinophils Relative: 1 % (ref 0–5)
HCT: 42.4 % (ref 39.0–52.0)
Hemoglobin: 14.3 g/dL (ref 13.0–17.0)
Lymphocytes Relative: 10 % — ABNORMAL LOW (ref 12–46)
Lymphs Abs: 1.4 10*3/uL (ref 0.7–4.0)
MCH: 30.2 pg (ref 26.0–34.0)
MCHC: 33.7 g/dL (ref 30.0–36.0)
MCV: 89.5 fL (ref 78.0–100.0)
Monocytes Absolute: 1.7 10*3/uL — ABNORMAL HIGH (ref 0.1–1.0)
Monocytes Relative: 12 % (ref 3–12)
NEUTROS ABS: 11.3 10*3/uL — AB (ref 1.7–7.7)
Neutrophils Relative %: 77 % (ref 43–77)
PLATELETS: 215 10*3/uL (ref 150–400)
RBC: 4.74 MIL/uL (ref 4.22–5.81)
RDW: 14.1 % (ref 11.5–15.5)
WBC: 14.5 10*3/uL — ABNORMAL HIGH (ref 4.0–10.5)

## 2014-06-30 LAB — CREATININE, SERUM: CREATININE: 0.77 mg/dL (ref 0.50–1.35)

## 2014-06-30 LAB — I-STAT CHEM 8, ED
BUN: 5 mg/dL — AB (ref 6–23)
CALCIUM ION: 1.12 mmol/L (ref 1.12–1.23)
Chloride: 102 mmol/L (ref 96–112)
Creatinine, Ser: 0.7 mg/dL (ref 0.50–1.35)
GLUCOSE: 102 mg/dL — AB (ref 70–99)
HEMATOCRIT: 47 % (ref 39.0–52.0)
HEMOGLOBIN: 16 g/dL (ref 13.0–17.0)
Potassium: 3.4 mmol/L — ABNORMAL LOW (ref 3.5–5.1)
Sodium: 141 mmol/L (ref 135–145)
TCO2: 21 mmol/L (ref 0–100)

## 2014-06-30 LAB — RAPID URINE DRUG SCREEN, HOSP PERFORMED
AMPHETAMINES: NOT DETECTED
BARBITURATES: NOT DETECTED
Benzodiazepines: POSITIVE — AB
Cocaine: NOT DETECTED
OPIATES: POSITIVE — AB
Tetrahydrocannabinol: NOT DETECTED

## 2014-06-30 MED ORDER — LIDOCAINE HCL 2 % IJ SOLN
5.0000 mL | Freq: Once | INTRAMUSCULAR | Status: DC
  Filled 2014-06-30: qty 20

## 2014-06-30 MED ORDER — HYDROMORPHONE HCL 1 MG/ML IJ SOLN
0.5000 mg | Freq: Once | INTRAMUSCULAR | Status: AC
  Administered 2014-06-30: 0.5 mg via INTRAMUSCULAR
  Filled 2014-06-30: qty 1

## 2014-06-30 MED ORDER — TAMSULOSIN HCL 0.4 MG PO CAPS
0.4000 mg | ORAL_CAPSULE | Freq: Every day | ORAL | Status: DC
  Administered 2014-07-01 – 2014-07-03 (×3): 0.4 mg via ORAL
  Filled 2014-06-30 (×3): qty 1

## 2014-06-30 MED ORDER — ENOXAPARIN SODIUM 40 MG/0.4ML ~~LOC~~ SOLN
40.0000 mg | SUBCUTANEOUS | Status: DC
  Administered 2014-07-01 – 2014-07-03 (×3): 40 mg via SUBCUTANEOUS
  Filled 2014-06-30 (×3): qty 0.4

## 2014-06-30 MED ORDER — ACETAMINOPHEN 650 MG RE SUPP: 650.0000 mg | Freq: Four times a day (QID) | RECTAL | Status: DC | PRN

## 2014-06-30 MED ORDER — VANCOMYCIN HCL IN DEXTROSE 1-5 GM/200ML-% IV SOLN
1000.0000 mg | Freq: Once | INTRAVENOUS | Status: AC
  Administered 2014-06-30: 1000 mg via INTRAVENOUS
  Filled 2014-06-30: qty 200

## 2014-06-30 MED ORDER — ONDANSETRON HCL 4 MG PO TABS: 4.0000 mg | ORAL_TABLET | Freq: Four times a day (QID) | ORAL | Status: DC | PRN

## 2014-06-30 MED ORDER — HYDRALAZINE HCL 20 MG/ML IJ SOLN: 10.0000 mg | Freq: Four times a day (QID) | INTRAMUSCULAR | Status: DC | PRN

## 2014-06-30 MED ORDER — ALBUTEROL SULFATE (2.5 MG/3ML) 0.083% IN NEBU: 2.5000 mg | INHALATION_SOLUTION | Freq: Four times a day (QID) | RESPIRATORY_TRACT | Status: DC | PRN

## 2014-06-30 MED ORDER — NICOTINE 21 MG/24HR TD PT24
21.0000 mg | MEDICATED_PATCH | Freq: Every day | TRANSDERMAL | Status: DC
  Administered 2014-07-01 – 2014-07-03 (×3): 21 mg via TRANSDERMAL
  Filled 2014-06-30 (×4): qty 1

## 2014-06-30 MED ORDER — VANCOMYCIN HCL IN DEXTROSE 1-5 GM/200ML-% IV SOLN
1000.0000 mg | Freq: Two times a day (BID) | INTRAVENOUS | Status: DC
  Administered 2014-06-30 – 2014-07-02 (×4): 1000 mg via INTRAVENOUS
  Filled 2014-06-30 (×6): qty 200

## 2014-06-30 MED ORDER — OXYCODONE HCL 5 MG PO TABS
5.0000 mg | ORAL_TABLET | ORAL | Status: DC | PRN
  Administered 2014-06-30 – 2014-07-03 (×12): 5 mg via ORAL
  Filled 2014-06-30 (×12): qty 1

## 2014-06-30 MED ORDER — ONDANSETRON HCL 4 MG/2ML IJ SOLN: 4.0000 mg | Freq: Four times a day (QID) | INTRAMUSCULAR | Status: DC | PRN

## 2014-06-30 MED ORDER — VANCOMYCIN HCL IN DEXTROSE 1-5 GM/200ML-% IV SOLN
1000.0000 mg | Freq: Once | INTRAVENOUS | Status: DC
Start: 2014-06-30 — End: 2014-06-30

## 2014-06-30 MED ORDER — POTASSIUM CHLORIDE CRYS ER 20 MEQ PO TBCR
40.0000 meq | EXTENDED_RELEASE_TABLET | Freq: Once | ORAL | Status: AC
  Administered 2014-06-30: 40 meq via ORAL
  Filled 2014-06-30: qty 2

## 2014-06-30 MED ORDER — DOCUSATE SODIUM 100 MG PO CAPS
100.0000 mg | ORAL_CAPSULE | Freq: Two times a day (BID) | ORAL | Status: DC
  Administered 2014-06-30 – 2014-07-02 (×5): 100 mg via ORAL
  Filled 2014-06-30 (×6): qty 1

## 2014-06-30 MED ORDER — ALBUTEROL SULFATE HFA 108 (90 BASE) MCG/ACT IN AERS: 2.0000 | INHALATION_SPRAY | RESPIRATORY_TRACT | Status: DC | PRN

## 2014-06-30 MED ORDER — MORPHINE SULFATE 2 MG/ML IJ SOLN
2.0000 mg | INTRAMUSCULAR | Status: DC | PRN
  Administered 2014-06-30 – 2014-07-02 (×5): 2 mg via INTRAVENOUS
  Administered 2014-07-02: 4 mg via INTRAVENOUS
  Administered 2014-07-02 – 2014-07-03 (×4): 2 mg via INTRAVENOUS
  Filled 2014-06-30 (×4): qty 1
  Filled 2014-06-30: qty 2
  Filled 2014-06-30 (×3): qty 1
  Filled 2014-06-30: qty 2
  Filled 2014-06-30: qty 1

## 2014-06-30 MED ORDER — ACETAMINOPHEN 325 MG PO TABS
650.0000 mg | ORAL_TABLET | Freq: Four times a day (QID) | ORAL | Status: DC | PRN
  Administered 2014-07-01 – 2014-07-02 (×2): 650 mg via ORAL
  Filled 2014-06-30 (×2): qty 2

## 2014-06-30 MED ORDER — SODIUM CHLORIDE 0.9 % IV BOLUS (SEPSIS)
1000.0000 mL | Freq: Once | INTRAVENOUS | Status: AC
  Administered 2014-06-30: 1000 mL via INTRAVENOUS

## 2014-06-30 MED ORDER — SODIUM CHLORIDE 0.9 % IV SOLN
INTRAVENOUS | Status: DC
  Administered 2014-07-01 – 2014-07-02 (×3): via INTRAVENOUS

## 2014-06-30 MED ORDER — ALUM & MAG HYDROXIDE-SIMETH 200-200-20 MG/5ML PO SUSP: 30.0000 mL | Freq: Four times a day (QID) | ORAL | Status: DC | PRN

## 2014-06-30 NOTE — ED Provider Notes (Signed)
CSN: [id number]     Arrival date & time 06/30/14  1250 History   First MD Initiated Contact with Patient 06/30/14 1309     Chief Complaint  Patient presents with  . Recurrent Skin Infections     (Consider location/radiation/quality/duration/timing/severity/associated sxs/prior Treatment) The history is provided by the patient.   patient presents with an infection of his left arm. Seen in ER 2 days ago for the same. Was given a prescription for clindamycin, however states he was not able to afford it due to the cost. He's had no other antibiotics. States has gotten worse. He states he does not have a thermometer at home. No numbness or weakness. He states he has had previous cysts removed and infections in other spots. No recent antibiotics.  Past Medical History  Diagnosis Date  . Hypertension    Past Surgical History  Procedure Laterality Date  . Knee surgery      x 3  . Cyst removals     Family History  Problem Relation Age of Onset  . COPD Mother   . Stroke Mother   . Hypertension Mother   . Cancer Father     prostate  . Hypertension Father   . Arthritis Maternal Grandfather   . Cancer Paternal Grandmother     breast   History  Substance Use Topics  . Smoking status: Current Every Day Smoker -- 0.50 packs/day    Types: Cigarettes  . Smokeless tobacco: Not on file  . Alcohol Use: Yes    Review of Systems  Constitutional: Negative for activity change and appetite change.  Eyes: Negative for pain.  Respiratory: Negative for chest tightness and shortness of breath.   Cardiovascular: Negative for chest pain.  Gastrointestinal: Negative for abdominal pain.  Genitourinary: Negative for flank pain.  Musculoskeletal: Negative for neck stiffness.  Skin: Positive for color change and wound. Negative for rash.  Neurological: Negative for weakness, numbness and headaches.      Allergies  Review of patient's allergies indicates no known allergies.  Home Medications    Prior to Admission medications   Medication Sig Start Date End Date Taking? Authorizing Provider  acetaminophen (TYLENOL) 325 MG tablet Take 650 mg by mouth every 6 (six) hours as needed for pain (pain).    Yes Historical Provider, MD  aspirin 325 MG tablet Take 325 mg by mouth every 4 (four) hours as needed for mild pain.   Yes Historical Provider, MD  doxylamine, Sleep, (UNISOM) 25 MG tablet Take 25 mg by mouth at bedtime as needed.   Yes Historical Provider, MD  ibuprofen (ADVIL,MOTRIN) 200 MG tablet Take 600 mg by mouth every 6 (six) hours as needed for moderate pain.   Yes Historical Provider, MD  clindamycin (CLEOCIN) 150 MG capsule Take 3 capsules (450 mg total) by mouth 3 (three) times daily. Patient not taking: Reported on 06/30/2014 06/29/14   Candyce Churn III, MD  HYDROcodone-acetaminophen (NORCO/VICODIN) 5-325 MG per tablet Take 1-2 tablets by mouth every 6 (six) hours as needed. Patient not taking: Reported on 06/28/2014 05/02/13   Jillyn Ledger, PA-C   BP 150/97 mmHg  Pulse 89  Temp(Src) 98 F (36.7 C) (Oral)  Resp 16  SpO2 96% Physical Exam  Constitutional: He appears well-developed and well-nourished.  Patient appears somewhat uncomfortable  HENT:  Head: Normocephalic.  Cardiovascular:  Mild tachycardia  Pulmonary/Chest: Effort normal.  Abdominal: There is no tenderness.  Neurological: He is alert.  Skin: There is erythema.  Erythema and  some swelling of left upper extremity. Starts proximally near the axilla and works its way down to mid forearm. There is approximate 1.5 cm swollen abscess area on the antecubital area. He does have a strong radial pulse.   reddened area has progressed outside the demarcated area from previous visit.    ED Course  Procedures (including critical care time) Labs Review Labs Reviewed  CBC WITH DIFFERENTIAL/PLATELET - Abnormal; Notable for the following:    WBC 14.5 (*)    Neutro Abs 11.3 (*)    Lymphocytes Relative 10  (*)    Monocytes Absolute 1.7 (*)    All other components within normal limits  I-STAT CHEM 8, ED - Abnormal; Notable for the following:    Potassium 3.4 (*)    BUN 5 (*)    Glucose, Bld 102 (*)    All other components within normal limits  CULTURE, ROUTINE-ABSCESS  URINE RAPID DRUG SCREEN (HOSP PERFORMED)    Imaging Review No results found.   EKG Interpretation None      MDM   Final diagnoses:  Abscess of antecubital fossa   Patient with abscess of left forearm/antecubital area. Moderate tenderness. Seen 2 days ago it has worsened. However he did not take the antibiotics due to cost. Wound was opened with a moderate amount of drainage. No packing placed but was freely draining. We will continue reevaluation and will be admitted to internal medicine.  INCISION AND DRAINAGE Performed by: Billee CashingPICKERING,Marye Eagen R. Consent: Verbal consent obtained. Risks and benefits: risks, benefits and alternatives were discussed Type: abscess  Body area: left anticubital area  Incision was made with a scalpel to the thinned area in the center of the abscess.   Complexity: simple Blunt dissection to break up loculations  Drainage: purulent  Drainage amount: moderate  pressure to area to allow drainage.   Patient tolerance: Patient tolerated the procedure well with no immediate complications.        Juliet RudeNathan R. Rubin PayorPickering, MD 06/30/14 1531

## 2014-06-30 NOTE — H&P (Signed)
[facility] History and Physical                                                                                    Aaron Roberson, is a 44 y.o. male  MRN: [id number]   DOB - [birth year]  Admit Date - 06/30/2014  Outpatient Primary MD for the patient is Kaleen Mask, MD  With History of -  Past Medical History  Diagnosis Date  . Hypertension       Past Surgical History  Procedure Laterality Date  . Knee surgery      x 3  . Cyst removals      in for   Chief Complaint  Patient presents with  . 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 Aaron Roberson states that he had no trauma to his arm but noticed pain starting late Wednesday evening. The pain worsened on Thursday and he went to the emergency department. He was given 2 pills (clinda) at the emergency department and a prescription for clindamycin. When he went to fill the prescription the cost was $90 and he was unable to afford it. He returns to the ER today with worsening cellulitis that has spread up to his shoulder and a new abscess that has developed over his bicep area.  Patient also complains of dysuria stating that it takes him a long time and a lot of effort to be able to urinate. This concerns him because his father had prostate cancer.   In the ER his white count was 14.5, his potassium was mildly low, and his blood pressure was elevated at 136/102. The EDP incised his abscess and drained a large amount of purulent material. Wound culture was sent to the lab. He was started on vancomycin IV and will be admitted to [facility] for severe purulent cellulitis.  Review of Systems   In addition to the HPI above,  No Fever-chills, No Headache, No changes with Vision or hearing, No problems swallowing food or Liquids, No Chest  pain, Cough or Shortness of Breath, + He complains of diffuse abdominal pain worse in the periumbilical area. No Blood in stool or Urine, + Urinary hesitancy. No new skin rashes or bruises, No new joints pains-aches,  No new weakness, tingling, numbness in any extremity, + 15 pound weight loss in the last 3-4 months. A full 10 point Review of Systems was done, except as stated above, all other Review of Systems were negative.  Social History History  Substance Use Topics  . Smoking status: Current Every Day Smoker -- 0.50 packs/day    Types: Cigarettes  . Smokeless tobacco: Not on file  . Alcohol Use: Yes    Family History Family History  Problem Relation Age of Onset  . COPD Mother   . Stroke Mother   . Hypertension Mother   . Cancer Father     prostate  . Hypertension Father   . Arthritis Maternal Grandfather   . Cancer Paternal Grandmother  breast    Prior to Admission medications   Medication Sig Start Date End Date Taking? Authorizing Provider  acetaminophen (TYLENOL) 325 MG tablet Take 650 mg by mouth every 6 (six) hours as needed for pain (pain).    Yes Historical Provider, MD  aspirin 325 MG tablet Take 325 mg by mouth every 4 (four) hours as needed for mild pain.   Yes Historical Provider, MD  doxylamine, Sleep, (UNISOM) 25 MG tablet Take 25 mg by mouth at bedtime as needed.   Yes Historical Provider, MD  ibuprofen (ADVIL,MOTRIN) 200 MG tablet Take 600 mg by mouth every 6 (six) hours as needed for moderate pain.   Yes Historical Provider, MD  clindamycin (CLEOCIN) 150 MG capsule Take 3 capsules (450 mg total) by mouth 3 (three) times daily. Patient not taking: Reported on 06/30/2014 06/29/14   Candyce Churn III, MD  HYDROcodone-acetaminophen (NORCO/VICODIN) 5-325 MG per tablet Take 1-2 tablets by mouth every 6 (six) hours as needed. Patient not taking: Reported on 06/28/2014 05/02/13   Jillyn Ledger, PA-C    No Known Allergies  Physical  Exam  Vitals  Blood pressure 150/97, pulse 89, temperature 98 F (36.7 C), temperature source Oral, resp. rate 16, SpO2 96 %.   General:  Well-developed, thin male, lying in bed in NAD  Psych:  Normal affect and insight, Not Suicidal or Homicidal, Awake Alert, Oriented X 3.  Neuro:   No F.N deficits, ALL C.Nerves Intact, Strength 5/5 all 4 extremities, Sensation intact all 4 extremities.  ENT:  Ears and Eyes appear Normal, Conjunctivae clear, PER. Moist oral mucosa without erythema or exudates.  Neck:  Supple, No lymphadenopathy appreciated  Respiratory:  Coarse breath sounds with some rhonchi bilaterally, good air movement.  Cardiac:  RRR, No Murmurs, no LE edema noted, no JVD.    Abdomen:  Positive bowel sounds, Soft, Non tender, Non distended,  No masses appreciated  Skin:  Edema and erythema in the left upper extremity. Erythema has extended far beyond the tracing made Thursday at [facility] ER.  Extremities:  Able to move all 4. 5/5 strength in each,  no effusions.  Data Review  CBC  Recent Labs Lab 06/30/14 1354 06/30/14 1400  WBC 14.5*  --   HGB 14.3 16.0  HCT 42.4 47.0  PLT 215  --   MCV 89.5  --   MCH 30.2  --   MCHC 33.7  --   RDW 14.1  --   LYMPHSABS 1.4  --   MONOABS 1.7*  --   EOSABS 0.1  --   BASOSABS 0.0  --     Chemistries   Recent Labs Lab 06/30/14 1400  NA 141  K 3.4*  CL 102  GLUCOSE 102*  BUN 5*  CREATININE 0.70    Assessment & Plan  Principal Problem:   Abscess and cellulitis Active Problems:   HTN (hypertension)   Dysuria   Tobacco abuse   Abscess and cellulitis. Will place on IV vancomycin. Please follow wound cultures. I have requested that the area of erythema be traced. Please monitor abscess reaccumulation to determine if Gen. surgery consult as necessary. We'll check hemoglobin A1c, urinary drug screen, and HIV.  Hypertension Patient has no formal diagnosis of hypertension, but reports this blood pressure is always  high when it's taken Will monitor in-house. PRN hydralazine ordered.  Dysuria with urinary hesitancy PSA ordered. UA ordered. If positive for infection please order culture.  Tobacco dependence with Abnormal breath sounds We'll  check two-view chest x-ray.  Albuterol inhaler. Further recommendations will be made based upon chest x-ray results. Have counseled patient to stop smoking.  Nicotine Patch ordered.  Hypokalemia (mild) Will supplement orally.  AM bmet ordered.  DVT Prophylaxis: Lovenox  AM Labs Ordered, also please review Full Orders  Family Communication:   None at bedside. Patient is alert, oriented and understands the plan of care.  Code Status:  Full code  Condition:  Guarded, but stable  Time spent in minutes : [address]   Duey Liller York,  PA-C on 06/30/2014 at 3:26 PM  Between 7am to 7pm - Pager - [phone]  After 7pm go to [email or web address] - password [email or web address]  And look for the night coverage person covering me after hours  [facility]

## 2014-06-30 NOTE — ED Notes (Signed)
[facility]. He was given RX for abx but they were too expensive and he was unable to afford them.  He returns today with severe L arm pain, redness and swelling beyond skin marker, and chills.

## 2014-06-30 NOTE — Progress Notes (Signed)
ANTIBIOTIC CONSULT NOTE - INITIAL  Pharmacy Consult for Vancomycin Indication: cellulitis  No Known Allergies  Patient Measurements: 72 kg  Vital Signs: Temp: 98 F (36.7 C) (02/13 1259) Temp Source: Oral (02/13 1259) BP: 150/97 mmHg (02/13 1500) Pulse Rate: 89 (02/13 1500)  Labs:  Recent Labs  06/30/14 1354 06/30/14 1400  WBC 14.5*  --   HGB 14.3 16.0  PLT 215  --   CREATININE  --  0.70   CrCl cannot be calculated (Unknown ideal weight.). No results for input(s): VANCOTROUGH, VANCOPEAK, VANCORANDOM, GENTTROUGH, GENTPEAK, GENTRANDOM, TOBRATROUGH, TOBRAPEAK, TOBRARND, AMIKACINPEAK, AMIKACINTROU, AMIKACIN in the last 72 hours.   Microbiology: No results found for this or any previous visit (from the past 720 hour(s)).  Medical History: Past Medical History  Diagnosis Date  . Hypertension     Assessment: 44 year old male to begin vancomycin for cellulitis S/p drainage of abscess in the ED   Goal of Therapy:  Vancomycin trough level 10-15 mcg/ml  Plan:  Vancomycin 1 gram iv Q 12 hours (next dose at midnight) Follow up renal function, fever trend, cultures  Thank you. Okey RegalLisa Yael Coppess, PharmD [phone]  06/30/2014,4:17 PM

## 2014-07-01 ENCOUNTER — Inpatient Hospital Stay (HOSPITAL_COMMUNITY): Payer: Self-pay

## 2014-07-01 ENCOUNTER — Encounter (HOSPITAL_COMMUNITY): Payer: Self-pay | Admitting: Radiology

## 2014-07-01 DIAGNOSIS — R300 Dysuria: Secondary | ICD-10-CM

## 2014-07-01 LAB — CBC
HCT: 39.2 % (ref 39.0–52.0)
Hemoglobin: 12.8 g/dL — ABNORMAL LOW (ref 13.0–17.0)
MCH: 30.1 pg (ref 26.0–34.0)
MCHC: 32.7 g/dL (ref 30.0–36.0)
MCV: 92.2 fL (ref 78.0–100.0)
Platelets: 192 10*3/uL (ref 150–400)
RBC: 4.25 MIL/uL (ref 4.22–5.81)
RDW: 14.5 % (ref 11.5–15.5)
WBC: 8.6 10*3/uL (ref 4.0–10.5)

## 2014-07-01 LAB — BASIC METABOLIC PANEL
ANION GAP: 8 (ref 5–15)
BUN: 6 mg/dL (ref 6–23)
CHLORIDE: 105 mmol/L (ref 96–112)
CO2: 25 mmol/L (ref 19–32)
Calcium: 8.7 mg/dL (ref 8.4–10.5)
Creatinine, Ser: 0.77 mg/dL (ref 0.50–1.35)
GFR calc Af Amer: >90 mL/min (ref >90)
Glucose, Bld: 102 mg/dL — ABNORMAL HIGH (ref 70–99)
Potassium: 3.9 mmol/L (ref 3.5–5.1)
Sodium: 138 mmol/L (ref 135–145)

## 2014-07-01 MED ORDER — IOHEXOL 300 MG/ML  SOLN
100.0000 mL | Freq: Once | INTRAMUSCULAR | Status: AC | PRN
  Administered 2014-07-01: 100 mL via INTRAVENOUS

## 2014-07-01 NOTE — Progress Notes (Signed)
Utilization Review Completed.Aaron Roberson T2/14/2016  

## 2014-07-01 NOTE — Progress Notes (Signed)
P.A Gus PumaJim Bethune in the unit verbally ordered CT scan of entire Lt arm to evaluate abscess. Verbal order read back. Order carried out.

## 2014-07-01 NOTE — Consult Note (Signed)
Reason for Consult:cellulitis of the left upper extremity Referring Physician: hospitalists  Aaron Roberson is an 44 y.o. male.  HPI: the patient is a 44 year old male who was presented to the emergency room with cellulitis of the forearm.  He was initially treated and discharged.  He was unable take medication after discharge and subsequent had worsening of cellulitis and perhaps a secondary abscess.  He be presented to emergency room where an irrigation debridement of the small abscess over the biceps tendon was performed and he was admitted for IV antibiotic therapy.  We are consulted for evaluation and management as the upper extremity team on-call today does not do forearm cellulitis.  Past Medical History  Diagnosis Date  . Hypertension     Past Surgical History  Procedure Laterality Date  . Knee surgery      x 3  . Cyst removals      Family History  Problem Relation Age of Onset  . COPD Mother   . Stroke Mother   . Hypertension Mother   . Cancer Father     prostate  . Hypertension Father   . Arthritis Maternal Grandfather   . Cancer Paternal Grandmother     breast    Social History:  reports that he has been smoking Cigarettes.  He has been smoking about 0.50 packs per day. He does not have any smokeless tobacco history on file. He reports that he drinks alcohol. He reports that he does not use illicit drugs.  Allergies: No Known Allergies  Medications: I have reviewed the patient's current medications.  Results for orders placed or performed during the hospital encounter of 06/30/14 (from the past 48 hour(s))  CBC with Differential     Status: Abnormal   Collection Time: 06/30/14  1:54 PM  Result Value Ref Range   WBC 14.5 (H) 4.0 - 10.5 K/uL   RBC 4.74 4.22 - 5.81 MIL/uL   Hemoglobin 14.3 13.0 - 17.0 g/dL   HCT 42.4 39.0 - 52.0 %   MCV 89.5 78.0 - 100.0 fL   MCH 30.2 26.0 - 34.0 pg   MCHC 33.7 30.0 - 36.0 g/dL   RDW 14.1 11.5 - 15.5 %   Platelets 215 150 -  400 K/uL   Neutrophils Relative % 77 43 - 77 %   Neutro Abs 11.3 (H) 1.7 - 7.7 K/uL   Lymphocytes Relative 10 (L) 12 - 46 %   Lymphs Abs 1.4 0.7 - 4.0 K/uL   Monocytes Relative 12 3 - 12 %   Monocytes Absolute 1.7 (H) 0.1 - 1.0 K/uL   Eosinophils Relative 1 0 - 5 %   Eosinophils Absolute 0.1 0.0 - 0.7 K/uL   Basophils Relative 0 0 - 1 %   Basophils Absolute 0.0 0.0 - 0.1 K/uL  I-stat chem 8, ed     Status: Abnormal   Collection Time: 06/30/14  2:00 PM  Result Value Ref Range   Sodium 141 135 - 145 mmol/L   Potassium 3.4 (L) 3.5 - 5.1 mmol/L   Chloride 102 96 - 112 mmol/L   BUN 5 (L) 6 - 23 mg/dL   Creatinine, Ser 0.70 0.50 - 1.35 mg/dL   Glucose, Bld 102 (H) 70 - 99 mg/dL   Calcium, Ion 1.12 1.12 - 1.23 mmol/L   TCO2 21 0 - 100 mmol/L   Hemoglobin 16.0 13.0 - 17.0 g/dL   HCT 47.0 39.0 - 52.0 %  Urine rapid drug screen (hosp performed)  Status: Abnormal   Collection Time: 06/30/14  3:34 PM  Result Value Ref Range   Opiates POSITIVE (A) NONE DETECTED   Cocaine NONE DETECTED NONE DETECTED   Benzodiazepines POSITIVE (A) NONE DETECTED   Amphetamines NONE DETECTED NONE DETECTED   Tetrahydrocannabinol NONE DETECTED NONE DETECTED   Barbiturates NONE DETECTED NONE DETECTED    Comment:        DRUG SCREEN FOR MEDICAL PURPOSES ONLY.  IF CONFIRMATION IS NEEDED FOR ANY PURPOSE, NOTIFY LAB WITHIN 5 DAYS.        LOWEST DETECTABLE LIMITS FOR URINE DRUG SCREEN Drug Class       Cutoff (ng/mL) Amphetamine      1000 Barbiturate      200 Benzodiazepine   637 Tricyclics       858 Opiates          300 Cocaine          300 THC              50   CBC     Status: Abnormal   Collection Time: 06/30/14  4:45 PM  Result Value Ref Range   WBC 13.0 (H) 4.0 - 10.5 K/uL   RBC 4.68 4.22 - 5.81 MIL/uL   Hemoglobin 14.2 13.0 - 17.0 g/dL   HCT 42.3 39.0 - 52.0 %   MCV 90.4 78.0 - 100.0 fL   MCH 30.3 26.0 - 34.0 pg   MCHC 33.6 30.0 - 36.0 g/dL   RDW 14.4 11.5 - 15.5 %   Platelets 212 150 - 400  K/uL  Creatinine, serum     Status: None   Collection Time: 06/30/14  4:45 PM  Result Value Ref Range   Creatinine, Ser 0.77 0.50 - 1.35 mg/dL   GFR calc non Af Amer >90 >90 mL/min   GFR calc Af Amer >90 >90 mL/min    Comment: (NOTE) The eGFR has been calculated using the CKD EPI equation. This calculation has not been validated in all clinical situations. eGFR's persistently <90 mL/min signify possible Chronic Kidney Disease.   Urinalysis, Routine w reflex microscopic     Status: None   Collection Time: 06/30/14  7:50 PM  Result Value Ref Range   Color, Urine YELLOW YELLOW   APPearance CLEAR CLEAR   Specific Gravity, Urine 1.020 1.005 - 1.030   pH 5.5 5.0 - 8.0   Glucose, UA NEGATIVE NEGATIVE mg/dL   Hgb urine dipstick NEGATIVE NEGATIVE   Bilirubin Urine NEGATIVE NEGATIVE   Ketones, ur NEGATIVE NEGATIVE mg/dL   Protein, ur NEGATIVE NEGATIVE mg/dL   Urobilinogen, UA 1.0 0.0 - 1.0 mg/dL   Nitrite NEGATIVE NEGATIVE   Leukocytes, UA NEGATIVE NEGATIVE    Comment: MICROSCOPIC NOT DONE ON URINES WITH NEGATIVE PROTEIN, BLOOD, LEUKOCYTES, NITRITE, OR GLUCOSE <1000 mg/dL.  Basic metabolic panel     Status: Abnormal   Collection Time: 07/01/14  5:56 AM  Result Value Ref Range   Sodium 138 135 - 145 mmol/L   Potassium 3.9 3.5 - 5.1 mmol/L   Chloride 105 96 - 112 mmol/L   CO2 25 19 - 32 mmol/L   Glucose, Bld 102 (H) 70 - 99 mg/dL   BUN 6 6 - 23 mg/dL   Creatinine, Ser 0.77 0.50 - 1.35 mg/dL   Calcium 8.7 8.4 - 10.5 mg/dL   GFR calc non Af Amer >90 >90 mL/min   GFR calc Af Amer >90 >90 mL/min    Comment: (NOTE) The eGFR has been calculated using  the CKD EPI equation. This calculation has not been validated in all clinical situations. eGFR's persistently <90 mL/min signify possible Chronic Kidney Disease.    Anion gap 8 5 - 15  CBC     Status: Abnormal   Collection Time: 07/01/14  5:56 AM  Result Value Ref Range   WBC 8.6 4.0 - 10.5 K/uL   RBC 4.25 4.22 - 5.81 MIL/uL    Hemoglobin 12.8 (L) 13.0 - 17.0 g/dL   HCT 39.2 39.0 - 52.0 %   MCV 92.2 78.0 - 100.0 fL   MCH 30.1 26.0 - 34.0 pg   MCHC 32.7 30.0 - 36.0 g/dL   RDW 14.5 11.5 - 15.5 %   Platelets 192 150 - 400 K/uL    X-ray Chest Pa And Lateral  06/30/2014   CLINICAL DATA:  Productive cough.  History of hypertension.  EXAM: CHEST  2 VIEW  COMPARISON:  05/02/2013  FINDINGS: The heart size and mediastinal contours are within normal limits. Both lungs are clear. The visualized skeletal structures are unremarkable.  IMPRESSION: No active cardiopulmonary disease.   Electronically Signed   By: Andreas Newport M.D.   On: 06/30/2014 17:45    ROS  ROS: I have reviewed the patient's review of systems thoroughly and there are no positive responses as relates to the HPI.  Blood pressure 120/86, pulse 97, temperature 98.2 F (36.8 C), temperature source Oral, resp. rate 18, SpO2 100 %. Physical Exam Well-developed well-nourished patient in no acute distress. Alert and oriented x3 HEENT:within normal limits Cardiac: Regular rate and rhythm Pulmonary: Lungs clear to auscultation Abdomen: Soft and nontender.  Normal active bowel sounds  Musculoskeletal: (left upper extremity: There is wide erythema over the upper extremity going into the axilla.  There is a small open wound over the volar arm. There is no severe pain to palpation.  There is no pain with active or passive motion of the fingers.  There is mild pain with rotation of the arm. Assessment/Plan: 44 year old male with a small abscess and cellulitis over the arm.  He was initially and adequately treated with oral medication as he could not afford it.  He is currently afebrile and has a mildly elevated white count.  He has been readmitted on IV antibiotic therapy currently.  He does not have signs of necrotizing fasciitis or compartment syndrome.//At this point the appropriate course of action will be warm compresses, elevation, and continued IV antibiotic  therapy.  We will follow the patient while in hospital and be happy to follow him as an outpatient.  Brennan Litzinger L 07/01/2014, 1:49 PM

## 2014-07-01 NOTE — Progress Notes (Signed)
[facility]                                                                              Patient Demographics  Aaron Roberson, is a 44 y.o. male, DOB - [birth year], ZOX:[id number]  Admit date - 06/30/2014   Admitting Physician Hollice Espy, MD  Outpatient Primary MD for the patient is Kaleen Mask, MD  LOS - 1   Chief Complaint  Patient presents with  . Recurrent Skin Infections      HPI on 06/30/2014 by [name], [place] with 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 Mr. Helms states that he had no trauma to his arm but noticed pain starting late Wednesday evening. The pain worsened on Thursday and he went to the emergency department. He was given 2 pills (clinda) at the emergency department and a prescription for clindamycin. When he went to fill the prescription the cost was $90 and he was unable to afford it. He returns to the ER today with worsening cellulitis that has spread up to his shoulder and a new abscess that has developed over his bicep area. Patient also complains of dysuria stating that it takes him a long time and a lot of effort to be able to urinate. This concerns him because his father had prostate cancer.   In the ER his white count was 14.5, his potassium was mildly low, and his blood pressure was elevated at 136/102. The EDP incised his abscess and drained a large amount of purulent material. Wound culture was sent to the lab. He was started on vancomycin IV and will be admitted to [facility] for severe purulent cellulitis.  Assessment & Plan  Sepsis secondary to LUE Cellulitis/Abscess -Patient was tachycardic with leukocytosis upon admission, however this is improving -Patient had previously presented to [facility] ED and was given antibiotics, however, did not have them filled due  to cost- Per ED photo, cellulitis has worsened- area is marked  -Patient cannot recall any injury or trauma to the area  -patient underwent I&D by ER attending, per admitting provider, once I&D occurred, there was pus  -Wound culture pending -Continue IV vancomycin -Orthopedics consulted and appreciated (Hand specialist, [name] was called, but does not tend to elbow or shoulder issues)  Hypertension -Patient does not have a history of hypertension however reports it is usually high when taken  Tobacco abuse -Chest x-ray obtained: No active cardiopulmonary disease -Smoking cessation counseling given -Continue nicotine patch  Dysuria with urinary hesitancy -PSA pending -UA: Negative -Continue Flomax  Hypokalemia -Resolved, will continue to monitor and replace as needed  Abdominal pain -Resolved  Code Status: Full  Family Communication: None at bedside  Disposition Plan: Admitted, continue IV antibiotics  Time Spent in minutes   30 minutes  Procedures  I&D in ER  Consults   Orthopedic surgery, Dr. Luiz Blare  DVT Prophylaxis  Lovenox  Lab Results  Component Value Date   PLT 192 07/01/2014    Medications  Scheduled Meds: . docusate sodium  100 mg  Oral BID  . enoxaparin (LOVENOX) injection  40 mg Subcutaneous Q24H  . nicotine  21 mg Transdermal Daily  . tamsulosin  0.4 mg Oral Daily  . vancomycin  1,000 mg Intravenous Q12H   Continuous Infusions: . sodium chloride 75 mL/hr at 07/01/14 0635   PRN Meds:.acetaminophen **OR** acetaminophen, albuterol, alum & mag hydroxide-simeth, hydrALAZINE, morphine injection, ondansetron **OR** ondansetron (ZOFRAN) IV, oxyCODONE  Antibiotics    Anti-infectives    Start     Dose/Rate Route Frequency Ordered Stop   07/01/14 0000  vancomycin (VANCOCIN) IVPB 1000 mg/200 mL premix     1,000 mg 200 mL/hr over 60 Minutes Intravenous Every 12 hours 06/30/14 1622     06/30/14 1615  vancomycin (VANCOCIN) IVPB 1000 mg/200 mL premix   Status:  Discontinued     1,000 mg 200 mL/hr over 60 Minutes Intravenous  Once 06/30/14 1614 06/30/14 1628   06/30/14 1330  vancomycin (VANCOCIN) IVPB 1000 mg/200 mL premix     1,000 mg 200 mL/hr over 60 Minutes Intravenous  Once 06/30/14 1318 06/30/14 1528        Subjective:   [name] seen and examined today.  Patient continues to complain of left arm pain and swelling. He cannot recall any injury or insect bites to the area. He denies any chest pain or shortness of breath at this time. Patient did have some abdominal pain which has resolved.   Objective:   Filed Vitals:   06/30/14 1500 06/30/14 1630 06/30/14 2149 07/01/14 0700  BP: 150/97 141/91 120/86   Pulse: 89 100 97   Temp:  98.3 F (36.8 C) 98.9 F (37.2 C) 98.2 F (36.8 C)  TempSrc:  Oral Oral   Resp:  18 18   SpO2: 96% 97% 100%     Wt Readings from Last 3 Encounters:  10/05/12 72.122 kg (159 lb)  09/18/12 70.308 kg (155 lb)  09/08/10 73.029 kg (161 lb)     Intake/Output Summary (Last 24 hours) at 07/01/14 1145 Last data filed at 06/30/14 1700  Gross per 24 hour  Intake    320 ml  Output      0 ml  Net    320 ml    Exam  General: Well developed, well nourished, NAD, appears stated age  HEENT: NCAT, mucous membranes moist.   Cardiovascular: S1 S2 auscultated, no rubs, murmurs or gallops. Regular rate and rhythm.  Respiratory: Clear to auscultation bilaterally with equal chest rise  Abdomen: Soft, nontender, nondistended, + bowel sounds  Extremities: warm dry without cyanosis clubbing or edema  Neuro: AAOx3, nonfocal  Skin: Erythema and edema of the left upper extremity extending from the forearm to the axilla, forearm erythema has improved however erythema in the upper left extremity has extended beyond tracing  Psych: Normal affect and demeanor with intact judgement and insight  Data Review   Micro Results No results found for this or any previous visit (from the past 240  hour(s)).  Radiology Reports X-ray Chest Pa And Lateral  06/30/2014   CLINICAL DATA:  Productive cough.  History of hypertension.  EXAM: CHEST  2 VIEW  COMPARISON:  05/02/2013  FINDINGS: The heart size and mediastinal contours are within normal limits. Both lungs are clear. The visualized skeletal structures are unremarkable.  IMPRESSION: No active cardiopulmonary disease.   Electronically Signed   By: Ellery Plunkaniel R Mitchell M.D.   On: 06/30/2014 17:45    CBC  Recent Labs Lab 06/30/14 1354 06/30/14 1400 06/30/14 1645 07/01/14 0556  WBC 14.5*  --  13.0* 8.6  HGB 14.3 16.0 14.2 12.8*  HCT 42.4 47.0 42.3 39.2  PLT 215  --  212 192  MCV 89.5  --  90.4 92.2  MCH 30.2  --  30.3 30.1  MCHC 33.7  --  33.6 32.7  RDW 14.1  --  14.4 14.5  LYMPHSABS 1.4  --   --   --   MONOABS 1.7*  --   --   --   EOSABS 0.1  --   --   --   BASOSABS 0.0  --   --   --     Chemistries   Recent Labs Lab 06/30/14 1400 06/30/14 1645 07/01/14 0556  NA 141  --  138  K 3.4*  --  3.9  CL 102  --  105  CO2  --   --  25  GLUCOSE 102*  --  102*  BUN 5*  --  6  CREATININE 0.70 0.77 0.77  CALCIUM  --   --  8.7   ------------------------------------------------------------------------------------------------------------------ CrCl cannot be calculated (Unknown ideal weight.). ------------------------------------------------------------------------------------------------------------------ No results for input(s): HGBA1C in the last 72 hours. ------------------------------------------------------------------------------------------------------------------ No results for input(s): CHOL, HDL, LDLCALC, TRIG, CHOLHDL, LDLDIRECT in the last 72 hours. ------------------------------------------------------------------------------------------------------------------ No results for input(s): TSH, T4TOTAL, T3FREE, THYROIDAB in the last 72 hours.  Invalid input(s):  FREET3 ------------------------------------------------------------------------------------------------------------------ No results for input(s): VITAMINB12, FOLATE, FERRITIN, TIBC, IRON, RETICCTPCT in the last 72 hours.  Coagulation profile No results for input(s): INR, PROTIME in the last 168 hours.  No results for input(s): DDIMER in the last 72 hours.  Cardiac Enzymes No results for input(s): CKMB, TROPONINI, MYOGLOBIN in the last 168 hours.  Invalid input(s): CK ------------------------------------------------------------------------------------------------------------------ Invalid input(s): POCBNP    Melvin Marmo D.O. on 07/01/2014 at 11:45 AM  Between 7am to 7pm - Pager - [phone]  After 7pm go to [email or web address] - password [email or web address]  And look for the night coverage person covering for me after hours  [facility] Office  [phone]

## 2014-07-02 LAB — BASIC METABOLIC PANEL
ANION GAP: 5 (ref 5–15)
BUN: 7 mg/dL (ref 6–23)
CO2: 24 mmol/L (ref 19–32)
Calcium: 8.8 mg/dL (ref 8.4–10.5)
Chloride: 107 mmol/L (ref 96–112)
Creatinine, Ser: 0.7 mg/dL (ref 0.50–1.35)
GFR calc Af Amer: >90 mL/min (ref >90)
GFR calc non Af Amer: >90 mL/min (ref >90)
GLUCOSE: 103 mg/dL — AB (ref 70–99)
Potassium: 3.6 mmol/L (ref 3.5–5.1)
Sodium: 136 mmol/L (ref 135–145)

## 2014-07-02 LAB — CBC
HCT: 40.6 % (ref 39.0–52.0)
HEMOGLOBIN: 13.7 g/dL (ref 13.0–17.0)
MCH: 31.2 pg (ref 26.0–34.0)
MCHC: 33.7 g/dL (ref 30.0–36.0)
MCV: 92.5 fL (ref 78.0–100.0)
PLATELETS: 218 10*3/uL (ref 150–400)
RBC: 4.39 MIL/uL (ref 4.22–5.81)
RDW: 14.1 % (ref 11.5–15.5)
WBC: 7.3 10*3/uL (ref 4.0–10.5)

## 2014-07-02 LAB — PSA: PSA: 0.35 ng/mL (ref <=4.00)

## 2014-07-02 LAB — HEMOGLOBIN A1C
Hgb A1c MFr Bld: 5.6 % (ref 4.8–5.6)
Mean Plasma Glucose: 114 mg/dL

## 2014-07-02 LAB — VANCOMYCIN, TROUGH: Vancomycin Tr: 4.5 ug/mL — ABNORMAL LOW (ref 10.0–20.0)

## 2014-07-02 LAB — HIV ANTIBODY (ROUTINE TESTING W REFLEX): HIV SCREEN 4TH GENERATION: NONREACTIVE

## 2014-07-02 MED ORDER — KETOROLAC TROMETHAMINE 30 MG/ML IJ SOLN
30.0000 mg | Freq: Once | INTRAMUSCULAR | Status: AC
  Administered 2014-07-02: 30 mg via INTRAMUSCULAR
  Filled 2014-07-02: qty 1

## 2014-07-02 MED ORDER — VANCOMYCIN HCL IN DEXTROSE 1-5 GM/200ML-% IV SOLN
1000.0000 mg | Freq: Three times a day (TID) | INTRAVENOUS | Status: DC
  Administered 2014-07-02 – 2014-07-03 (×3): 1000 mg via INTRAVENOUS
  Filled 2014-07-02 (×5): qty 200

## 2014-07-02 NOTE — Progress Notes (Signed)
Subjective: Complains of less pain in his left arm than  last night.  Objective: Vital signs in last 24 hours: Temp:  [97.6 F (36.4 C)-98.4 F (36.9 C)] 97.8 F (36.6 C) (02/15 0743) Pulse Rate:  [81-93] 89 (02/15 0743) Resp:  [18] 18 (02/15 0743) BP: (120-139)/(78-91) 132/85 mmHg (02/15 0743) SpO2:  [95 %-98 %] 96 % (02/15 0743)  Intake/Output from previous day:   Intake/Output this shift: Total I/O In: 3061.3 [P.O.:480; I.V.:1981.3; IV Piggyback:600] Out: -    Recent Labs  06/30/14 1354 06/30/14 1400 06/30/14 1645 07/01/14 0556 07/02/14 0802  HGB 14.3 16.0 14.2 12.8* 13.7    Recent Labs  07/01/14 0556 07/02/14 0802  WBC 8.6 7.3  RBC 4.25 4.39  HCT 39.2 40.6  PLT 192 218    Recent Labs  07/01/14 0556 07/02/14 0802  NA 138 136  K 3.9 3.6  CL 105 107  CO2 25 24  BUN 6 7  CREATININE 0.77 0.70  GLUCOSE 102* 103*  CALCIUM 8.7 8.8   No results for input(s): LABPT, INR in the last 72 hours. CT scan of the left arm negative for abscess. Left upper extremity exam: Still has redness and swelling that has moved proximal, but it is improved overall.  Less pain with range of motion of the elbow and wrist.  No axillary nodes palpable.  Assessment/Plan: Cellulitis left upper extremity. Plan: At this point this is not a surgical problem. Continue IV antibiotics and close observation. We will follow the patient.   Kaylana Fenstermacher G 07/02/2014, 10:28 AM

## 2014-07-02 NOTE — Progress Notes (Signed)
[facility]                                                                              Patient Demographics  Aaron Roberson, is a 44 y.o. male, DOB - 01-13-1971, YQM:[id number]  Admit date - 06/30/2014   Admitting Physician Hollice Espy, MD  Outpatient Primary MD for the patient is Kaleen Mask, MD  LOS - 2   Chief Complaint  Patient presents with  . Recurrent Skin Infections      HPI on 06/30/2014 by [name], [place] with 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 Mr. Feldner states that he had no trauma to his arm but noticed pain starting late Wednesday evening. The pain worsened on Thursday and he went to the emergency department. He was given 2 pills (clinda) at the emergency department and a prescription for clindamycin. When he went to fill the prescription the cost was $90 and he was unable to afford it. He returns to the ER today with worsening cellulitis that has spread up to his shoulder and a new abscess that has developed over his bicep area. Patient also complains of dysuria stating that it takes him a long time and a lot of effort to be able to urinate. This concerns him because his father had prostate cancer.   In the ER his white count was 14.5, his potassium was mildly low, and his blood pressure was elevated at 136/102. The EDP incised his abscess and drained a large amount of purulent material. Wound culture was sent to the lab. He was started on vancomycin IV and will be admitted to [facility] for severe purulent cellulitis.  Assessment & Plan  Sepsis secondary to LUE Cellulitis/Abscess -Patient was tachycardic with leukocytosis upon admission, however this is improving -Patient had previously presented to [facility] ED and was given antibiotics, however, did not have them filled due  to cost- Per ED photo, cellulitis has worsened- area is marked  -Patient cannot recall any injury or trauma to the area  -patient underwent I&D by ER attending, per admitting provider, once I&D occurred, there was pus  -Wound culture pending -Continue IV vancomycin -Orthopedics consulted and appreciated (Hand specialist, [name] was called, but does not tend to elbow or shoulder issues) -Erythema improving  Hypertension -Improved -Patient does not have a history of hypertension however reports it is usually high when taken  Tobacco abuse -Chest x-ray obtained: No active cardiopulmonary disease -Smoking cessation counseling given -Continue nicotine patch  Dysuria with urinary hesitancy -PSA pending -UA: Negative -Continue Flomax  Hypokalemia -Resolved, will continue to monitor and replace as needed  Abdominal pain -Resolved  Code Status: Full  Family Communication: None at bedside  Disposition Plan: Admitted, continue IV antibiotics  Time Spent in minutes   30 minutes  Procedures  I&D in ER  Consults   Orthopedic surgery, Dr. Luiz Blare  DVT Prophylaxis  Lovenox  Lab Results  Component Value Date   PLT 218 07/02/2014    Medications  Scheduled Meds: . docusate sodium  100 mg Oral BID  . enoxaparin (LOVENOX) injection  40 mg Subcutaneous Q24H  . nicotine  21 mg Transdermal Daily  . tamsulosin  0.4 mg Oral Daily  . vancomycin  1,000 mg Intravenous Q12H   Continuous Infusions: . sodium chloride 75 mL/hr at 07/02/14 0857   PRN Meds:.acetaminophen **OR** acetaminophen, albuterol, alum & mag hydroxide-simeth, hydrALAZINE, morphine injection, ondansetron **OR** ondansetron (ZOFRAN) IV, oxyCODONE  Antibiotics    Anti-infectives    Start     Dose/Rate Route Frequency Ordered Stop   07/01/14 0000  vancomycin (VANCOCIN) IVPB 1000 mg/200 mL premix     1,000 mg 200 mL/hr over 60 Minutes Intravenous Every 12 hours 06/30/14 1622     06/30/14 1615  vancomycin  (VANCOCIN) IVPB 1000 mg/200 mL premix  Status:  Discontinued     1,000 mg 200 mL/hr over 60 Minutes Intravenous  Once 06/30/14 1614 06/30/14 1628   06/30/14 1330  vancomycin (VANCOCIN) IVPB 1000 mg/200 mL premix     1,000 mg 200 mL/hr over 60 Minutes Intravenous  Once 06/30/14 1318 06/30/14 1528        Subjective:   Ellene RouteEdward Tiffany seen and examined today.  Patient continues to complain of left arm pain and swelling- however feels that the pain and swelling have improved.  He cannot make a fist with this left hand due to pain.   He denies any chest pain or shortness of breath, abdominal pain at this time.  Objective:   Filed Vitals:   07/01/14 0700 07/01/14 1404 07/01/14 2109 07/02/14 0743  BP:  120/78 139/91 132/85  Pulse:  81 93 89  Temp: 98.2 F (36.8 C) 97.6 F (36.4 C) 98.4 F (36.9 C) 97.8 F (36.6 C)  TempSrc:  Oral Oral Oral  Resp:  18 18 18   SpO2:  95% 98% 96%    Wt Readings from Last 3 Encounters:  10/05/12 72.122 kg (159 lb)  09/18/12 70.308 kg (155 lb)  09/08/10 73.029 kg (161 lb)     Intake/Output Summary (Last 24 hours) at 07/02/14 1045 Last data filed at 07/02/14 0900  Gross per 24 hour  Intake 3061.25 ml  Output      0 ml  Net 3061.25 ml    Exam  General: Well developed, NAD  HEENT: NCAT, mucous membranes moist.   Cardiovascular: S1 S2 auscultated, RRR, no murmurs  Respiratory: Clear to auscultation bilaterally with equal chest rise  Abdomen: Soft, nontender, nondistended, + bowel sounds  Extremities: warm dry without cyanosis clubbing or edema  Neuro: AAOx3, nonfocal  Skin: Erythema and edema of the LUE improving, edema improving  Psych: Appropriate mood and affect  Data Review   Micro Results Recent Results (from the past 240 hour(s))  Culture, routine-abscess     Status: None (Preliminary result)   Collection Time: 06/30/14  2:37 PM  Result Value Ref Range Status   Specimen Description ABSCESS LEFT FOREARM  Final   Special  Requests NONE  Final   Gram Stain PENDING  Incomplete   Culture   Final    Culture reincubated for better growth Performed at [facility]    Report Status PENDING  Incomplete  Wound culture     Status: None (Preliminary result)   Collection Time: 07/01/14  2:20 PM  Result Value Ref Range Status   Specimen Description WOUND LEFT ARM  Final   Special Requests NONE  Final   Gram Stain PENDING  Incomplete   Culture NO GROWTH Performed at [facility]   Final   Report Status PENDING  Incomplete    Radiology Reports X-ray Chest Pa And Lateral  06/30/2014   CLINICAL DATA:  Productive cough.  History of hypertension.  EXAM: CHEST  2 VIEW  COMPARISON:  05/02/2013  FINDINGS: The heart size and mediastinal contours are within normal limits. Both lungs are clear. The visualized skeletal structures are unremarkable.  IMPRESSION: No active cardiopulmonary disease.   Electronically Signed   By: Ellery Plunk M.D.   On: 06/30/2014 17:45   Ct Extrem Up Entire Arm L W/cm  07/01/2014   CLINICAL DATA:  Left forearm cellulitis. Mildly elevated white blood cell count.  EXAM: CT OF THE UPPER LEFT ARM WITH CONTRAST  TECHNIQUE: Contiguous axial CT images were taken of the right arm and forearm after the administration of IV contrast material. Additional coronal and sagittal reformatted images are provided  CONTRAST:  OMNIPAQUE IOHEXOL 300 MG/ML  SOLN  COMPARISON:  None.  FINDINGS: A tiny fluid collection measuring 1.2 cm transverse by 1.4 cm long by 0.4 cm AP is seen in the antecubital fossa deep to the basilic vein. The collection is deep to a small skin wound. There is little to no flow in the basilic vein at the level of this collection. No intramuscular fluid collection is identified. No gas is seen dissecting along fascial planes or elsewhere. No bony abnormality to suggest osteomyelitis is identified.  IMPRESSION: Cellulitis of the right arm with a very small abscess seen deep to a  small skin wound and the basilic vein in the antecubital fossa. A very short segment of the basilic vein immediately anterior to the collection has little to no contrast within it compatible with septic thrombophlebitis   Electronically Signed   By: Drusilla Kanner M.D.   On: 07/01/2014 22:08    CBC  Recent Labs Lab 06/30/14 1354 06/30/14 1400 06/30/14 1645 07/01/14 0556 07/02/14 0802  WBC 14.5*  --  13.0* 8.6 7.3  HGB 14.3 16.0 14.2 12.8* 13.7  HCT 42.4 47.0 42.3 39.2 40.6  PLT 215  --  212 192 218  MCV 89.5  --  90.4 92.2 92.5  MCH 30.2  --  30.3 30.1 31.2  MCHC 33.7  --  33.6 32.7 33.7  RDW 14.1  --  14.4 14.5 14.1  LYMPHSABS 1.4  --   --   --   --   MONOABS 1.7*  --   --   --   --   EOSABS 0.1  --   --   --   --   BASOSABS 0.0  --   --   --   --     Chemistries   Recent Labs Lab 06/30/14 1400 06/30/14 1645 07/01/14 0556 07/02/14 0802  NA 141  --  138 136  K 3.4*  --  3.9 3.6  CL 102  --  105 107  CO2  --   --  25 24  GLUCOSE 102*  --  102* 103*  BUN 5*  --  6 7  CREATININE 0.70 0.77 0.77 0.70  CALCIUM  --   --  8.7 8.8   ------------------------------------------------------------------------------------------------------------------ CrCl cannot be calculated (Unknown ideal weight.). ------------------------------------------------------------------------------------------------------------------  Recent Labs  06/30/14 1645  HGBA1C 5.6   ------------------------------------------------------------------------------------------------------------------ No results for input(s): CHOL, HDL, LDLCALC, TRIG, CHOLHDL, LDLDIRECT in the last 72 hours. ------------------------------------------------------------------------------------------------------------------ No results for input(s): TSH, T4TOTAL, T3FREE, THYROIDAB in the last 72 hours.  Invalid input(s):  FREET3 ------------------------------------------------------------------------------------------------------------------ No  results for input(s): VITAMINB12, FOLATE, FERRITIN, TIBC, IRON, RETICCTPCT in the last 72 hours.  Coagulation profile No results for input(s): INR, PROTIME in the last 168 hours.  No results for input(s): DDIMER in the last 72 hours.  Cardiac Enzymes No results for input(s): CKMB, TROPONINI, MYOGLOBIN in the last 168 hours.  Invalid input(s): CK ------------------------------------------------------------------------------------------------------------------ Invalid input(s): POCBNP    Greco Gastelum D.O. on 07/02/2014 at 10:45 AM  Between 7am to 7pm - Pager - [phone]  After 7pm go to [email or web address] - password [email or web address]  And look for the night coverage person covering for me after hours  [facility] Office  [phone]

## 2014-07-02 NOTE — Progress Notes (Signed)
ANTIBIOTIC CONSULT NOTE Pharmacy Consult for Vancomycin Indication: cellulitis  No Known Allergies  Patient Measurements: 72 kg  Vital Signs: Temp: 97.8 F (36.6 C) (02/15 0743) Temp Source: Oral (02/15 0743) BP: 132/85 mmHg (02/15 0743) Pulse Rate: 89 (02/15 0743)  Labs:  Recent Labs  06/30/14 1645 07/01/14 0556 07/02/14 0802  WBC 13.0* 8.6 7.3  HGB 14.2 12.8* 13.7  PLT 212 192 218  CREATININE 0.77 0.77 0.70   CrCl cannot be calculated (Unknown ideal weight.).  Recent Labs  07/02/14 1440  VANCOTROUGH 4.5*     Microbiology: Recent Results (from the past 720 hour(s))  Culture, routine-abscess     Status: None (Preliminary result)   Collection Time: 06/30/14  2:37 PM  Result Value Ref Range Status   Specimen Description ABSCESS LEFT FOREARM  Final   Special Requests NONE  Final   Gram Stain PENDING  Incomplete   Culture   Final    Culture reincubated for better growth Performed at [facility]    Report Status PENDING  Incomplete  Wound culture     Status: None (Preliminary result)   Collection Time: 07/01/14  2:20 PM  Result Value Ref Range Status   Specimen Description WOUND LEFT ARM  Final   Special Requests NONE  Final   Gram Stain   Final    RARE WBPM NO SQUAMOUS EPITHELIAL CELLS SEEN NO ORGANISMS SEEN Performed at [facility]    Culture NO GROWTH Performed at [facility]   Final   Report Status PENDING  Incomplete  Anaerobic culture     Status: None (Preliminary result)   Collection Time: 07/01/14  2:20 PM  Result Value Ref Range Status   Specimen Description WOUND LEFT ARM  Final   Special Requests NONE  Final   Gram Stain   Final    RARE WBC PRESENT, PREDOMINANTLY MONONUCLEAR NO SQUAMOUS EPITHELIAL CELLS SEEN NO ORGANISMS SEEN Performed at [facility]    Culture   Final    NO ANAEROBES ISOLATED; CULTURE IN PROGRESS FOR 5 DAYS Performed at [facility]    Report Status PENDING  Incomplete      Assessment: 44 year old male on  Vancomycin day #3 for cellulitis of LUE. CT shows no abscess.  Small remaining fluid around site if I&D but no large abscess.  S/p drainage of abscess in the ED on 2/13.  WBC down to WNL, Creat 0.7; Afebrile.   Vanc trough drawn 3 hours late is low at 4.5 mcg/ml.   2/14 L arm wound: ngtd 2/13 abscess L arm ngtd   Goal of Therapy:  Vancomycin trough level 10-15 mcg/ml  Plan:  -increase vancomycin to 1 gm IV q8h - f/u plans  Herby AbrahamMichelle T. Lahna Nath, Pharm.D. [phone] 07/02/2014 3:41 PM

## 2014-07-02 NOTE — Progress Notes (Signed)
Reviewed CT which shows no abscess in upper arm.  Small remaining fluid aroud site of I@D  bot no large abscess.  Will continur IV ABx and follow for now;

## 2014-07-03 ENCOUNTER — Encounter (HOSPITAL_COMMUNITY): Payer: Self-pay | Admitting: *Deleted

## 2014-07-03 LAB — CULTURE, ROUTINE-ABSCESS

## 2014-07-03 LAB — WOUND CULTURE

## 2014-07-03 MED ORDER — OXYCODONE HCL 5 MG PO TABS: 5.0000 mg | ORAL_TABLET | ORAL | Status: DC | PRN

## 2014-07-03 MED ORDER — NICOTINE 21 MG/24HR TD PT24: 21.0000 mg | MEDICATED_PATCH | Freq: Every day | TRANSDERMAL | Status: DC

## 2014-07-03 MED ORDER — IBUPROFEN 200 MG PO TABS: 600.0000 mg | ORAL_TABLET | Freq: Four times a day (QID) | ORAL | Status: DC | PRN

## 2014-07-03 MED ORDER — TAMSULOSIN HCL 0.4 MG PO CAPS: 0.4000 mg | ORAL_CAPSULE | Freq: Every day | ORAL | Status: DC

## 2014-07-03 MED ORDER — SULFAMETHOXAZOLE-TRIMETHOPRIM 800-160 MG PO TABS: 1.0000 | ORAL_TABLET | Freq: Two times a day (BID) | ORAL | Status: DC

## 2014-07-03 NOTE — Progress Notes (Signed)
Subjective: Complains of improved left arm pain although still hurts in wrist and elbow.   Objective: Vital signs in last 24 hours: Temp:  [97.5 F (36.4 C)-98.3 F (36.8 C)] 97.5 F (36.4 C) (02/16 0429) Pulse Rate:  [67-87] 67 (02/16 0429) Resp:  [17-18] 18 (02/16 0429) BP: (110-131)/(76-88) 110/76 mmHg (02/16 0429) SpO2:  [97 %-100 %] 100 % (02/16 0429)  Intake/Output from previous day: 02/15 0701 - 02/16 0700 In: 4501.3 [P.O.:1720; I.V.:1981.3; IV Piggyback:800] Out: 200 [Urine:200] Intake/Output this shift: Total I/O In: 480 [P.O.:480] Out: -    Recent Labs  06/30/14 1354 06/30/14 1400 06/30/14 1645 07/01/14 0556 07/02/14 0802  HGB 14.3 16.0 14.2 12.8* 13.7    Recent Labs  07/01/14 0556 07/02/14 0802  WBC 8.6 7.3  RBC 4.25 4.39  HCT 39.2 40.6  PLT 192 218    Recent Labs  07/01/14 0556 07/02/14 0802  NA 138 136  K 3.9 3.6  CL 105 107  CO2 25 24  BUN 6 7  CREATININE 0.77 0.70  GLUCOSE 102* 103*  CALCIUM 8.7 8.8   Cultures show no growth at this point.   Left upper extremity exam:  Decreasing redness. No significant induration. Still has pain with ROM of wrist and elbow but improved.     Assessment/Plan: Cellulitis left upper extremity improving with IV therapy  Plan:  Continue IV antibiotics per medicine  At some point can convert to oral antibiotics  We will follow while in hospital.  We will see in the office in one week after discharge.  Not a surgical problem at this point.    Aaron Roberson G 07/03/2014, 9:41 AM

## 2014-07-03 NOTE — Progress Notes (Signed)
Patient discharged per orders. All d/c orders/instructions/medications/prescriptions/wound care/follow up appointments discussed. Time allowed for questions and concerns. Patient stated he had none. Patient able to ambulate in room, pack up room without assistance. I walked with the patient to the main entrance (pt denied wheelchair). Patient picked up by family member for transport home. Asher Muir- Satoya Feeley [place],RN

## 2014-07-03 NOTE — Discharge Summary (Addendum)
Physician Discharge Summary  Aaron Roberson:[id number] DOB: 1970/08/12 DOA: 06/30/2014  PCP: Kaleen Mask, MD  Admit date: 06/30/2014 Discharge date: 07/03/2014  Time spent: 45 minutes  Recommendations for Outpatient Follow-up:  Patient will be discharged home. He should follow-up with a Primary care physician within 1 week of discharge. Patient should also follow-up with orthopedics within 1 week of discharge. Patient to continue taking his medications as prescribed. Patient should abstain from tobacco use.   Discharge Diagnoses:  Sepsis secondary to LUE cellulitis/abscess Hypertension Tobacco abuse Dysuria and urinary hesitancy Hypokalemia Abdominal pain  Discharge Condition: Stable  Diet recommendation: Regular  Filed Weights   07/03/14 1300  Weight: 72.12 kg (158 lb 15.9 oz)    History of present illness:  on 06/30/2014 by [name], Aaron Roberson with 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 Aaron Roberson states that he had no trauma to his arm but noticed pain starting late Wednesday evening. The pain worsened on Thursday and he went to the emergency department. He was given 2 pills (clinda) at the emergency department and a prescription for clindamycin. When he went to fill the prescription the cost was $90 and he was unable to afford it. He returns to the ER today with worsening cellulitis that has spread up to his shoulder and a new abscess that has developed over his bicep area. Patient also complains of dysuria stating that it takes him a long time and a lot of effort to be able to urinate. This concerns him because his father had prostate cancer.   In the ER his white count was 14.5, his potassium was mildly low, and his blood pressure was elevated at 136/102. The EDP incised his abscess and drained a  large amount of purulent material. Wound culture was sent to the lab. He was started on vancomycin IV and will be admitted to [facility] for severe purulent cellulitis.  Hospital Course:  Sepsis secondary to LUE Cellulitis/Abscess -Patient was tachycardic with leukocytosis upon admission, however this is improving -Patient had previously presented to [facility] ED and was given antibiotics, however, did not have them filled due to cost- Per ED photo, cellulitis has worsened- area is marked  -Patient cannot recall any injury or trauma to the area  -patient underwent I&D by ER attending, per admitting provider, once I&D occurred, there was pus  -Wound culture: Moderate GPC, abundant microaerophilic streptococci -Initially placed on IV vancomycin -Orthopedics consulted and appreciated (Hand specialist, [name] was called, but does not tend to elbow or shoulder issues) -Erythema improving -Patient will be discharged with Bactrim for an additional 10 days -He will need to follow-up with orthopedics in one week.  Hypertension -Improved -Possibly secondary to pain  Tobacco abuse -Chest x-ray obtained: No active cardiopulmonary disease -Smoking cessation counseling given -Continue nicotine patch  Dysuria with urinary hesitancy -PSA .0.35 -UA: Negative -Continue Flomax  Hypokalemia -Resolved  Abdominal pain -Resolved  Procedures  I&D in ER  Consults  Orthopedic surgery, Dr. Luiz Blare  Discharge Exam: Filed Vitals:   07/03/14 1353  BP: 121/82  Pulse: 82  Temp: 97.8 F (36.6 C)  Resp: 16   Exam  General: Well developed, NAD  HEENT: NCAT, mucous membranes moist.   Cardiovascular: S1 S2 auscultated, RRR, no murmurs  Respiratory: Clear to auscultation bilaterally with equal chest rise  Abdomen: Soft, nontender,  nondistended, + bowel sounds  Skin: Erythema and edema of the LUE improving, edema improving  Discharge Instructions      Discharge Instructions     Discharge instructions    Complete by:  As directed   Patient will be discharged home. He should follow-up with a Primary care physician/: [facility] within 1 week of discharge. Patient should also follow-up with orthopedics within 1 week of discharge. Patient to continue taking his medications as prescribed. Patient should abstain from tobacco use.            Medication List    STOP taking these medications        clindamycin 150 MG capsule  Commonly known as:  CLEOCIN     HYDROcodone-acetaminophen 5-325 MG per tablet  Commonly known as:  NORCO/VICODIN      TAKE these medications        acetaminophen 325 MG tablet  Commonly known as:  TYLENOL  Take 650 mg by mouth every 6 (six) hours as needed for pain (pain).     aspirin 325 MG tablet  Take 325 mg by mouth every 4 (four) hours as needed for mild pain.     doxylamine (Sleep) 25 MG tablet  Commonly known as:  UNISOM  Take 25 mg by mouth at bedtime as needed.     ibuprofen 200 MG tablet  Commonly known as:  ADVIL,MOTRIN  Take 600 mg by mouth every 6 (six) hours as needed for moderate pain.     nicotine 21 mg/24hr patch  Commonly known as:  NICODERM CQ - dosed in mg/24 hours  Place 1 patch (21 mg total) onto the skin daily.     oxyCODONE 5 MG immediate release tablet  Commonly known as:  Oxy IR/ROXICODONE  Take 1 tablet (5 mg total) by mouth every 4 (four) hours as needed for moderate pain.     sulfamethoxazole-trimethoprim 800-160 MG per tablet  Commonly known as:  BACTRIM DS,SEPTRA DS  Take 1 tablet by mouth 2 (two) times daily.     tamsulosin 0.4 MG Caps capsule  Commonly known as:  FLOMAX  Take 1 capsule (0.4 mg total) by mouth daily.       No Known Allergies Follow-up Information    Follow up with GRAVES,JOHN L, MD In 1 week.   Specialty:  Orthopedic Surgery   Contact information:   [address] [place] [address] [phone]       Follow up with Kaleen MaskELKINS,WILSON OLIVER, MD. Schedule an  appointment as soon as possible for a visit in 1 week.   Specialty:  Family Medicine   Why:  Hospital followup   Contact information:   [address] [place] [address] [phone]       Follow up with [facility]    .   Why:  appt 07/05/14 at 9am   Contact information:   [address] [place] [phone]       The results of significant diagnostics from this hospitalization (including imaging, microbiology, ancillary and laboratory) are listed below for reference.    Significant Diagnostic Studies: X-ray Chest Pa And Lateral  06/30/2014   CLINICAL DATA:  Productive cough.  History of hypertension.  EXAM: CHEST  2 VIEW  COMPARISON:  05/02/2013  FINDINGS: The heart size and mediastinal contours are within normal limits. Both lungs are clear. The visualized skeletal structures are unremarkable.  IMPRESSION: No active cardiopulmonary disease.   Electronically  Signed   By: Ellery Plunk M.D.   On: 06/30/2014 17:45   Ct Extrem Up Entire Arm L W/cm  07/01/2014   CLINICAL DATA:  Left forearm cellulitis. Mildly elevated white blood cell count.  EXAM: CT OF THE UPPER LEFT ARM WITH CONTRAST  TECHNIQUE: Contiguous axial CT images were taken of the right arm and forearm after the administration of IV contrast material. Additional coronal and sagittal reformatted images are provided  CONTRAST:  OMNIPAQUE IOHEXOL 300 MG/ML  SOLN  COMPARISON:  None.  FINDINGS: A tiny fluid collection measuring 1.2 cm transverse by 1.4 cm long by 0.4 cm AP is seen in the antecubital fossa deep to the basilic vein. The collection is deep to a small skin wound. There is little to no flow in the basilic vein at the level of this collection. No intramuscular fluid collection is identified. No gas is seen dissecting along fascial planes or elsewhere. No bony abnormality to suggest osteomyelitis is identified.  IMPRESSION: Cellulitis of the  right arm with a very small abscess seen deep to a small skin wound and the basilic vein in the antecubital fossa. A very short segment of the basilic vein immediately anterior to the collection has little to no contrast within it compatible with septic thrombophlebitis   Electronically Signed   By: Drusilla Kanner M.D.   On: 07/01/2014 22:08    Microbiology: Recent Results (from the past 240 hour(s))  Culture, routine-abscess     Status: None   Collection Time: 06/30/14  2:37 PM  Result Value Ref Range Status   Specimen Description ABSCESS LEFT FOREARM  Final   Special Requests NONE  Final   Gram Stain   Final    MODERATE WBC PRESENT, PREDOMINANTLY PMN NO SQUAMOUS EPITHELIAL CELLS SEEN MODERATE GRAM POSITIVE COCCI IN PAIRS Performed at [facility]    Culture   Final    ABUNDANT MICROAEROPHILIC STREPTOCOCCI Note: Standardized susceptibility testing for this organism is not available. Performed at [facility]    Report Status 07/03/2014 FINAL  Final  Wound culture     Status: None   Collection Time: 07/01/14  2:20 PM  Result Value Ref Range Status   Specimen Description WOUND LEFT ARM  Final   Special Requests NONE  Final   Gram Stain   Final    RARE WBPM NO SQUAMOUS EPITHELIAL CELLS SEEN NO ORGANISMS SEEN Performed at [facility]    Culture   Final    FEW MICROAEROPHILIC STREPTOCOCCI Note: Standardized susceptibility testing for this organism is not available. Performed at [facility]    Report Status 07/03/2014 FINAL  Final  Anaerobic culture     Status: None (Preliminary result)   Collection Time: 07/01/14  2:20 PM  Result Value Ref Range Status   Specimen Description WOUND LEFT ARM  Final   Special Requests NONE  Final   Gram Stain   Final    RARE WBC PRESENT, PREDOMINANTLY MONONUCLEAR NO SQUAMOUS EPITHELIAL CELLS SEEN NO ORGANISMS SEEN Performed at [facility]    Culture   Final    NO ANAEROBES ISOLATED; CULTURE IN  PROGRESS FOR 5 DAYS Performed at [facility]    Report Status PENDING  Incomplete     Labs: Basic Metabolic Panel:  Recent Labs Lab 06/30/14 1400 06/30/14 1645 07/01/14 0556 07/02/14 0802  NA 141  --  138 136  K 3.4*  --  3.9 3.6  CL 102  --  105 107  CO2  --   --  25 24  GLUCOSE 102*  --  102* 103*  BUN 5*  --  6 7  CREATININE 0.70 0.77 0.77 0.70  CALCIUM  --   --  8.7 8.8   Liver Function Tests: No results for input(s): AST, ALT, ALKPHOS, BILITOT, PROT, ALBUMIN in the last 168 hours. No results for input(s): LIPASE, AMYLASE in the last 168 hours. No results for input(s): AMMONIA in the last 168 hours. CBC:  Recent Labs Lab 06/30/14 1354 06/30/14 1400 06/30/14 1645 07/01/14 0556 07/02/14 0802  WBC 14.5*  --  13.0* 8.6 7.3  NEUTROABS 11.3*  --   --   --   --   HGB 14.3 16.0 14.2 12.8* 13.7  HCT 42.4 47.0 42.3 39.2 40.6  MCV 89.5  --  90.4 92.2 92.5  PLT 215  --  212 192 218   Cardiac Enzymes: No results for input(s): CKTOTAL, CKMB, CKMBINDEX, TROPONINI in the last 168 hours. BNP: BNP (last 3 results) No results for input(s): BNP in the last 8760 hours.  ProBNP (last 3 results) No results for input(s): PROBNP in the last 8760 hours.  CBG: No results for input(s): GLUCAP in the last 168 hours.     SignedEdsel Petrin  [facility] 07/03/2014, 3:18 PM

## 2014-07-03 NOTE — Discharge Instructions (Signed)
Abscess °An abscess (boil or furuncle) is an infected area on or under the skin. This area is filled with yellowish-white fluid (pus) and other material (debris). °HOME CARE  °· Only take medicines as told by your doctor. °· If you were given antibiotic medicine, take it as directed. Finish the medicine even if you start to feel better. °· If gauze is used, follow your doctor's directions for changing the gauze. °· To avoid spreading the infection: °¨ Keep your abscess covered with a bandage. °¨ Wash your hands well. °¨ Do not share personal care items, towels, or whirlpools with others. °¨ Avoid skin contact with others. °· Keep your skin and clothes clean around the abscess. °· Keep all doctor visits as told. °GET HELP RIGHT AWAY IF:  °· You have more pain, puffiness (swelling), or redness in the wound site. °· You have more fluid or blood coming from the wound site. °· You have muscle aches, chills, or you feel sick. °· You have a fever. °MAKE SURE YOU:  °· Understand these instructions. °· Will watch your condition. °· Will get help right away if you are not doing well or get worse. °Document Released: 10/21/2007 Document Revised: 11/03/2011 Document Reviewed: 07/17/2011 °[employer]® Patient Information ©2015 [employer]. This information is not intended to replace advice given to you by your health care provider. Make sure you discuss any questions you have with your health care provider. ° °

## 2014-07-04 NOTE — Progress Notes (Signed)
07/04/14 Spoke with patient on 07/03/14, [facility] at 9am. Gave patient discount pharmacy card.

## 2014-07-05 ENCOUNTER — Ambulatory Visit: Payer: Self-pay | Attending: Internal Medicine | Admitting: Internal Medicine

## 2014-07-05 ENCOUNTER — Encounter: Payer: Self-pay | Admitting: Internal Medicine

## 2014-07-05 VITALS — BP 108/74 | HR 77 | Temp 98.2°F | Resp 16 | Ht 72.0 in | Wt 164.0 lb

## 2014-07-05 DIAGNOSIS — Z792 Long term (current) use of antibiotics: Secondary | ICD-10-CM | POA: Insufficient documentation

## 2014-07-05 DIAGNOSIS — L03114 Cellulitis of left upper limb: Secondary | ICD-10-CM | POA: Insufficient documentation

## 2014-07-05 DIAGNOSIS — Z23 Encounter for immunization: Secondary | ICD-10-CM

## 2014-07-05 DIAGNOSIS — R3911 Hesitancy of micturition: Secondary | ICD-10-CM | POA: Insufficient documentation

## 2014-07-05 DIAGNOSIS — L02414 Cutaneous abscess of left upper limb: Secondary | ICD-10-CM | POA: Insufficient documentation

## 2014-07-05 DIAGNOSIS — L0390 Cellulitis, unspecified: Secondary | ICD-10-CM

## 2014-07-05 DIAGNOSIS — I10 Essential (primary) hypertension: Secondary | ICD-10-CM | POA: Insufficient documentation

## 2014-07-05 DIAGNOSIS — G4700 Insomnia, unspecified: Secondary | ICD-10-CM | POA: Insufficient documentation

## 2014-07-05 DIAGNOSIS — L0291 Cutaneous abscess, unspecified: Secondary | ICD-10-CM

## 2014-07-05 DIAGNOSIS — F17210 Nicotine dependence, cigarettes, uncomplicated: Secondary | ICD-10-CM | POA: Insufficient documentation

## 2014-07-05 MED ORDER — TAMSULOSIN HCL 0.4 MG PO CAPS: 0.4000 mg | ORAL_CAPSULE | Freq: Every day | ORAL | Status: DC

## 2014-07-05 MED ORDER — TRAZODONE HCL 50 MG PO TABS: 25.0000 mg | ORAL_TABLET | Freq: Every evening | ORAL | Status: DC | PRN

## 2014-07-05 MED ORDER — HYDROCODONE-ACETAMINOPHEN 5-325 MG PO TABS: 1.0000 | ORAL_TABLET | Freq: Three times a day (TID) | ORAL | Status: DC | PRN

## 2014-07-05 NOTE — Patient Instructions (Addendum)
Trazodone tablets What is this medicine? TRAZODONE (TRAZ oh done) is used to treat depression. This medicine may be used for other purposes; ask your health care provider or pharmacist if you have questions. COMMON BRAND NAME(S): Desyrel What should I tell my health care provider before I take this medicine? They need to know if you have any of these conditions: -attempted suicide or thinking about it -bipolar disorder -bleeding problems -glaucoma -heart disease, or previous heart attack -irregular heart beat -kidney or liver disease -low levels of sodium in the blood -an unusual or allergic reaction to trazodone, other medicines, foods, dyes or preservatives -pregnant or trying to get pregnant -breast-feeding How should I use this medicine? Take this medicine by mouth with a glass of water. Follow the directions on the prescription label. Take this medicine shortly after a meal or a light snack. Take your medicine at regular intervals. Do not take your medicine more often than directed. Do not stop taking this medicine suddenly except upon the advice of your doctor. Stopping this medicine too quickly may cause serious side effects or your condition may worsen. A special MedGuide will be given to you by the pharmacist with each prescription and refill. Be sure to read this information carefully each time. Talk to your pediatrician regarding the use of this medicine in children. Special care may be needed. Overdosage: If you think you have taken too much of this medicine contact a poison control center or emergency room at once. NOTE: This medicine is only for you. Do not share this medicine with others. What if I miss a dose? If you miss a dose, take it as soon as you can. If it is almost time for your next dose, take only that dose. Do not take double or extra doses. What may interact with this medicine? Do not take this medicine with any of the following medications: -certain medicines  for fungal infections like fluconazole, itraconazole, ketoconazole, posaconazole, voriconazole -cisapride -dofetilide -dronedarone -linezolid -MAOIs like Carbex, Eldepryl, Marplan, Nardil, and Parnate -mesoridazine -methylene blue (injected into a vein) -pimozide -saquinavir -thioridazine -ziprasidone This medicine may also interact with the following medications: -alcohol -antiviral medicines for HIV or AIDS -aspirin and aspirin-like medicines -barbiturates like phenobarbital -certain medicines for blood pressure, heart disease, irregular heart beat -certain medicines for depression, anxiety, or psychotic disturbances -certain medicines for migraine headache like almotriptan, eletriptan, frovatriptan, naratriptan, rizatriptan, sumatriptan, zolmitriptan -certain medicines for seizures like carbamazepine and phenytoin -certain medicines for sleep -certain medicines that treat or prevent blood clots like dalteparin, enoxaparin, warfarin -digoxin -fentanyl -lithium -NSAIDS, medicines for pain and inflammation, like ibuprofen or naproxen -other medicines that prolong the QT interval (cause an abnormal heart rhythm) -rasagiline -supplements like St. John's wort, kava kava, valerian -tramadol -tryptophan This list may not describe all possible interactions. Give your health care provider a list of all the medicines, herbs, non-prescription drugs, or dietary supplements you use. Also tell them if you smoke, drink alcohol, or use illegal drugs. Some items may interact with your medicine. What should I watch for while using this medicine? Tell your doctor if your symptoms do not get better or if they get worse. Visit your doctor or health care professional for regular checks on your progress. Because it may take several weeks to see the full effects of this medicine, it is important to continue your treatment as prescribed by your doctor. Patients and their families should watch out for new  or worsening thoughts of suicide or depression. Also   watch out for sudden changes in feelings such as feeling anxious, agitated, panicky, irritable, hostile, aggressive, impulsive, severely restless, overly excited and hyperactive, or not being able to sleep. If this happens, especially at the beginning of treatment or after a change in dose, call your health care professional. Aaron QuinYou may get drowsy or dizzy. Do not drive, use machinery, or do anything that needs mental alertness until you know how this medicine affects you. Do not stand or sit up quickly, especially if you are an older patient. This reduces the risk of dizzy or fainting spells. Alcohol may interfere with the effect of this medicine. Avoid alcoholic drinks. This medicine may cause dry eyes and blurred vision. If you wear contact lenses you may feel some discomfort. Lubricating drops may help. See your eye doctor if the problem does not go away or is severe. Your mouth may get dry. Chewing sugarless gum, sucking hard candy and drinking plenty of water may help. Contact your doctor if the problem does not go away or is severe. What side effects may I notice from receiving this medicine? Side effects that you should report to your doctor or health care professional as soon as possible: -allergic reactions like skin rash, itching or hives, swelling of the face, lips, or tongue -fast, irregular heartbeat -feeling faint or lightheaded, falls -painful erections or other sexual dysfunction -suicidal thoughts or other mood changes -trembling Side effects that usually do not require medical attention (report to your doctor or health care professional if they continue or are bothersome): -constipation -headache -muscle aches or pains -nausea, vomiting -unusually weak or tired This list may not describe all possible side effects. Call your doctor for medical advice about side effects. You may report side effects to FDA at 1-800-FDA-1088. Where  should I keep my medicine? Keep out of the reach of children. Store at room temperature between 15 and 30 degrees C (59 to 86 degrees F). Protect from light. Keep container tightly closed. Throw away any unused medicine after the expiration date. NOTE: This sheet is a summary. It may not cover all possible information. If you have questions about this medicine, talk to your doctor, pharmacist, or health care provider.  2015, Elsevier/Gold Standard. (2012-12-05 15:46:28) Smoking Cessation Quitting smoking is important to your health and has many advantages. However, it is not always easy to quit since nicotine is a very addictive drug. Oftentimes, people try 3 times or more before being able to quit. This document explains the best ways for you to prepare to quit smoking. Quitting takes hard work and a lot of effort, but you can do it. ADVANTAGES OF QUITTING SMOKING  You will live longer, feel better, and live better.  Your body will feel the impact of quitting smoking almost immediately.  Within 20 minutes, blood pressure decreases. Your pulse returns to its normal level.  After 8 hours, carbon monoxide levels in the blood return to normal. Your oxygen level increases.  After 24 hours, the chance of having a heart attack starts to decrease. Your breath, hair, and body stop smelling like smoke.  After 48 hours, damaged nerve endings begin to recover. Your sense of taste and smell improve.  After 72 hours, the body is virtually free of nicotine. Your bronchial tubes relax and breathing becomes easier.  After 2 to 12 weeks, lungs can hold more air. Exercise becomes easier and circulation improves.  The risk of having a heart attack, stroke, cancer, or lung disease is greatly reduced.  After 1 year, the risk of coronary heart disease is cut in half.  After 5 years, the risk of stroke falls to the same as a nonsmoker.  After 10 years, the risk of lung cancer is cut in half and the risk of  other cancers decreases significantly.  After 15 years, the risk of coronary heart disease drops, usually to the level of a nonsmoker.  If you are pregnant, quitting smoking will improve your chances of having a healthy baby.  The people you live with, especially any children, will be healthier.  You will have extra money to spend on things other than cigarettes. QUESTIONS TO THINK ABOUT BEFORE ATTEMPTING TO QUIT You may want to talk about your answers with your health care provider.  Why do you want to quit?  If you tried to quit in the past, what helped and what did not?  What will be the most difficult situations for you after you quit? How will you plan to handle them?  Who can help you through the tough times? Your family? Friends? A health care provider?  What pleasures do you get from smoking? What ways can you still get pleasure if you quit? Here are some questions to ask your health care provider:  How can you help me to be successful at quitting?  What medicine do you think would be best for me and how should I take it?  What should I do if I need more help?  What is smoking withdrawal like? How can I get information on withdrawal? GET READY  Set a quit date.  Change your environment by getting rid of all cigarettes, ashtrays, matches, and lighters in your home, car, or work. Do not let people smoke in your home.  Review your past attempts to quit. Think about what worked and what did not. GET SUPPORT AND ENCOURAGEMENT You have a better chance of being successful if you have help. You can get support in many ways.  Tell your family, friends, and coworkers that you are going to quit and need their support. Ask them not to smoke around you.  Get individual, group, or telephone counseling and support. Programs are available at [employer] and health centers. Call your local health department for information about programs in your area.  Spiritual beliefs and  practices may help some smokers quit.  Download a "quit meter" on your computer to keep track of quit statistics, such as how long you have gone without smoking, cigarettes not smoked, and money saved.  Get a self-help book about quitting smoking and staying off tobacco. LEARN NEW SKILLS AND BEHAVIORS  Distract yourself from urges to smoke. Talk to someone, go for a walk, or occupy your time with a task.  Change your normal routine. Take a different route to work. Drink tea instead of coffee. Eat breakfast in a different place.  Reduce your stress. Take a hot bath, exercise, or read a book.  Plan something enjoyable to do every day. Reward yourself for not smoking.  Explore interactive web-based programs that specialize in helping you quit. GET MEDICINE AND USE IT CORRECTLY Medicines can help you stop smoking and decrease the urge to smoke. Combining medicine with the above behavioral methods and support can greatly increase your chances of successfully quitting smoking.  Nicotine replacement therapy helps deliver nicotine to your body without the negative effects and risks of smoking. Nicotine replacement therapy includes nicotine gum, lozenges, inhalers, nasal sprays, and skin patches. Some may  be available over-the-counter and others require a prescription.  Antidepressant medicine helps people abstain from smoking, but how this works is unknown. This medicine is available by prescription.  Nicotinic receptor partial agonist medicine simulates the effect of nicotine in your brain. This medicine is available by prescription. Ask your health care provider for advice about which medicines to use and how to use them based on your health history. Your health care provider will tell you what side effects to look out for if you choose to be on a medicine or therapy. Carefully read the information on the package. Do not use any other product containing nicotine while using a nicotine replacement  product.  RELAPSE OR DIFFICULT SITUATIONS Most relapses occur within the first 3 months after quitting. Do not be discouraged if you start smoking again. Remember, most people try several times before finally quitting. You may have symptoms of withdrawal because your body is used to nicotine. You may crave cigarettes, be irritable, feel very hungry, cough often, get headaches, or have difficulty concentrating. The withdrawal symptoms are only temporary. They are strongest when you first quit, but they will go away within 10-14 days. To reduce the chances of relapse, try to:  Avoid drinking alcohol. Drinking lowers your chances of successfully quitting.  Reduce the amount of caffeine you consume. Once you quit smoking, the amount of caffeine in your body increases and can give you symptoms, such as a rapid heartbeat, sweating, and anxiety.  Avoid smokers because they can make you want to smoke.  Do not let weight gain distract you. Many smokers will gain weight when they quit, usually less than 10 pounds. Eat a healthy diet and stay active. You can always lose the weight gained after you quit.  Find ways to improve your mood other than smoking. FOR MORE INFORMATION  www.smokefree.gov  Document Released: 04/28/2001 Document Revised: 09/18/2013 Document Reviewed: 08/13/2011 [facility] Patient Information 2015 [place], [place]. This information is not intended to replace advice given to you by your health care provider. Make sure you discuss any questions you have with your health care provider.

## 2014-07-05 NOTE — Progress Notes (Signed)
Patient ID: Aaron Roberson, male   DOB: 1971-01-29, 44 y.o.   MRN: [id number]  [id number]  [id number]  DOB - Feb 28, 1971  CC:  Chief Complaint  Patient presents with  . Hospitalization Follow-up  . 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 Mr. Speigner states that he had no trauma to his arm but noticed pain starting a few days before. The first visit to Aaron Roberson he was given 2 pills (clinda) at the emergency department and a prescription for clindamycin. When he went to fill the prescription the cost was $90 and he was unable to afford it. On the second visit he was admitted with worsening cellulitis that has spread up to his shoulder and a new abscess that has developed over his bicep area.  He believes that he was poked by a nail at a old house at his job. He reports that the knot under the original I&D location has gotten larger and tender that is causing him to be increasingly tender up to his biceps. He notes that the swelling and erythema has improved. He has not had a tetanus shot in almost 20 years.   Patient also complains of hesitancy beginning his urine stream. He often has to sit or strain to begin his urine. This has been a ongoing problem for the past one year. This concerns him because his father has prostate cancer. He states that he has had difficulty sleeping over the past 3 years.  He states that he wakes often after only sleeping for 1-2 hours.  He has tried unisom, benadryl, tylenol PM, and Melatonin all without relief. He reports that he was on Trazodone in the past which seemed to help.   Patient has No headache, No chest pain, No abdominal pain - No Nausea, No new weakness tingling or numbness, No Cough - SOB.  No Known Allergies Past Medical History  Diagnosis Date  .  Hypertension    Current Outpatient Prescriptions on File Prior to Visit  Medication Sig Dispense Refill  . ibuprofen (ADVIL,MOTRIN) 200 MG tablet Take 600 mg by mouth every 6 (six) hours as needed for moderate pain.    Marland Kitchen oxyCODONE (OXY IR/ROXICODONE) 5 MG immediate release tablet Take 1 tablet (5 mg total) by mouth every 4 (four) hours as needed for moderate pain. 20 tablet 0  . sulfamethoxazole-trimethoprim (BACTRIM DS,SEPTRA DS) 800-160 MG per tablet Take 1 tablet by mouth 2 (two) times daily. 20 tablet 0  . acetaminophen (TYLENOL) 325 MG tablet Take 650 mg by mouth every 6 (six) hours as needed for pain (pain).     Marland Kitchen aspirin 325 MG tablet Take 325 mg by mouth every 4 (four) hours as needed for mild pain.    Marland Kitchen doxylamine, Sleep, (UNISOM) 25 MG tablet Take 25 mg by mouth at bedtime as needed.    . nicotine (NICODERM CQ - DOSED IN MG/24 HOURS) 21 mg/24hr patch Place 1 patch (21 mg total) onto the skin daily. (Patient not taking: Reported on 07/05/2014) 28 patch 0  . tamsulosin (FLOMAX) 0.4 MG CAPS capsule Take 1 capsule (0.4 mg total) by mouth daily. (Patient not taking: Reported on 07/05/2014) 30 capsule 0   No current facility-administered medications on file prior to visit.   Family History  Problem Relation Age of Onset  .  COPD Mother   . Stroke Mother   . Hypertension Mother   . Cancer Father     prostate  . Hypertension Father   . Arthritis Maternal Grandfather   . Cancer Paternal Grandmother     breast   History   Social History  . Marital Status: Single    Spouse Name: N/A  . Number of Children: N/A  . Years of Education: N/A   Occupational History  . Not on file.   Social History Main Topics  . Smoking status: Current Every Day Smoker -- 0.50 packs/day    Types: Cigarettes  . Smokeless tobacco: Not on file  . Alcohol Use: Yes  . Drug Use: No  . Sexual Activity: Not on file   Other Topics Concern  . Not on file   Social History Narrative    Review of  Systems: See HPI   Objective:   Filed Vitals:   07/05/14 0929  BP: 108/74  Pulse: 77  Temp: 98.2 F (36.8 C)  Resp: 16    Physical Exam  Constitutional: He is oriented to person, place, and time.  Neck: Normal range of motion. Neck supple.  Cardiovascular: Normal rate, regular rhythm and normal heart sounds.   Pulmonary/Chest: Effort normal and breath sounds normal.  Abdominal: Soft. Bowel sounds are normal.  Genitourinary: Prostate is tender.  Musculoskeletal: Normal range of motion.  Lymphadenopathy:    He has no cervical adenopathy.  Neurological: He is alert and oriented to person, place, and time.  Skin: Skin is warm. There is erythema.     Healed I&D location Erythematous, hard knot under scabbed over I&D location that is TTP  Psychiatric: He has a normal mood and affect.     Lab Results  Component Value Date   WBC 7.3 07/02/2014   HGB 13.7 07/02/2014   HCT 40.6 07/02/2014   MCV 92.5 07/02/2014   PLT 218 07/02/2014   Lab Results  Component Value Date   CREATININE 0.70 07/02/2014   BUN 7 07/02/2014   NA 136 07/02/2014   K 3.6 07/02/2014   CL 107 07/02/2014   CO2 24 07/02/2014    Lab Results  Component Value Date   HGBA1C 5.6 06/30/2014   Lipid Panel  No results found for: CHOL, TRIG, HDL, CHOLHDL, VLDL, LDLCALC     Assessment and plan:   Landon was seen today for hospitalization follow-up and establish care.  Diagnoses and all orders for this visit:  Abscess and cellulitis Orders: -     HYDROcodone-acetaminophen (NORCO) 5-325 MG per tablet; Take 1 tablet by mouth every 8 (eight) hours as needed for moderate pain. -     Tdap vaccine greater than or equal to 7yo IM  Urinary hesitancy Orders: -     PSA---will call with results. Tender prostate -    Refill tamsulosin (FLOMAX) 0.4 MG CAPS capsule; Take 1 capsule (0.4 mg total) by mouth daily.  Insomnia Orders: -     traZODone (DESYREL) 50 MG tablet; Take 0.5 tablets (25 mg total) by mouth  at bedtime as needed for sleep.   Return for Monday for wound recheck with RN. If no improvement in appearance of wound, may need antibiotic change.   The patient was given clear instructions to go to ER or return to medical center if symptoms don't improve, worsen or new problems develop. The patient verbalized understanding. The patient was told to call to get lab results if they haven't heard anything in the next  week.    Holland CommonsKECK, VALERIE, NP-C [facility] [phone] 07/05/2014, 9:47 AM

## 2014-07-05 NOTE — Progress Notes (Signed)
HFU Pt has an abscess on his left arm. Pt feels like it is getting worse. Pt reports having several surgery's on his right knee.  Now he feels popping and occasional pain.

## 2014-07-06 ENCOUNTER — Telehealth: Payer: Self-pay | Admitting: *Deleted

## 2014-07-06 LAB — ANAEROBIC CULTURE

## 2014-07-06 LAB — PSA: PSA: 0.27 ng/mL (ref <=4.00)

## 2014-07-06 NOTE — Telephone Encounter (Signed)
Pt aware of lab results 

## 2014-07-06 NOTE — Telephone Encounter (Signed)
-----   Message from Ambrose FinlandValerie A Keck, NP sent at 07/06/2014 10:20 AM EST ----- psa level is normal. Does not indicate cancer

## 2014-07-10 ENCOUNTER — Ambulatory Visit: Payer: Self-pay

## 2014-12-23 ENCOUNTER — Emergency Department (HOSPITAL_COMMUNITY): Payer: Self-pay

## 2014-12-23 ENCOUNTER — Emergency Department (HOSPITAL_COMMUNITY)
Admission: EM | Admit: 2014-12-23 | Discharge: 2014-12-24 | Disposition: A | Discharge status: Home / Self Care | Payer: Self-pay | Attending: Emergency Medicine | Admitting: Emergency Medicine

## 2014-12-23 ENCOUNTER — Encounter (HOSPITAL_COMMUNITY): Payer: Self-pay | Admitting: Nurse Practitioner

## 2014-12-23 DIAGNOSIS — T148XXA Other injury of unspecified body region, initial encounter: Secondary | ICD-10-CM

## 2014-12-23 DIAGNOSIS — Y9389 Activity, other specified: Secondary | ICD-10-CM | POA: Insufficient documentation

## 2014-12-23 DIAGNOSIS — S80811A Abrasion, right lower leg, initial encounter: Secondary | ICD-10-CM | POA: Insufficient documentation

## 2014-12-23 DIAGNOSIS — Z720 Tobacco use: Secondary | ICD-10-CM | POA: Insufficient documentation

## 2014-12-23 DIAGNOSIS — I10 Essential (primary) hypertension: Secondary | ICD-10-CM | POA: Insufficient documentation

## 2014-12-23 DIAGNOSIS — S80812A Abrasion, left lower leg, initial encounter: Secondary | ICD-10-CM | POA: Insufficient documentation

## 2014-12-23 DIAGNOSIS — Y998 Other external cause status: Secondary | ICD-10-CM | POA: Insufficient documentation

## 2014-12-23 DIAGNOSIS — Y92410 Unspecified street and highway as the place of occurrence of the external cause: Secondary | ICD-10-CM | POA: Insufficient documentation

## 2014-12-23 DIAGNOSIS — S0101XA Laceration without foreign body of scalp, initial encounter: Secondary | ICD-10-CM | POA: Insufficient documentation

## 2014-12-23 LAB — BASIC METABOLIC PANEL
ANION GAP: 8 (ref 5–15)
BUN: 10 mg/dL (ref 6–20)
CHLORIDE: 102 mmol/L (ref 101–111)
CO2: 28 mmol/L (ref 22–32)
Calcium: 9.2 mg/dL (ref 8.9–10.3)
Creatinine, Ser: 0.91 mg/dL (ref 0.61–1.24)
GFR calc Af Amer: >60 mL/min (ref >60)
Glucose, Bld: 130 mg/dL — ABNORMAL HIGH (ref 65–99)
POTASSIUM: 3.8 mmol/L (ref 3.5–5.1)
Sodium: 138 mmol/L (ref 135–145)

## 2014-12-23 LAB — CBC WITH DIFFERENTIAL/PLATELET
Basophils Absolute: 0 10*3/uL (ref 0.0–0.1)
Basophils Relative: 0 % (ref 0–1)
EOS ABS: 0 10*3/uL (ref 0.0–0.7)
EOS PCT: 0 % (ref 0–5)
HCT: 40.7 % (ref 39.0–52.0)
HEMOGLOBIN: 13.6 g/dL (ref 13.0–17.0)
LYMPHS ABS: 1 10*3/uL (ref 0.7–4.0)
Lymphocytes Relative: 8 % — ABNORMAL LOW (ref 12–46)
MCH: 30.1 pg (ref 26.0–34.0)
MCHC: 33.4 g/dL (ref 30.0–36.0)
MCV: 90 fL (ref 78.0–100.0)
MONO ABS: 0.7 10*3/uL (ref 0.1–1.0)
Monocytes Relative: 6 % (ref 3–12)
NEUTROS ABS: 10.8 10*3/uL — AB (ref 1.7–7.7)
Neutrophils Relative %: 86 % — ABNORMAL HIGH (ref 43–77)
Platelets: 230 10*3/uL (ref 150–400)
RBC: 4.52 MIL/uL (ref 4.22–5.81)
RDW: 13.8 % (ref 11.5–15.5)
WBC: 12.5 10*3/uL — AB (ref 4.0–10.5)

## 2014-12-23 MED ORDER — MORPHINE SULFATE 4 MG/ML IJ SOLN
4.0000 mg | Freq: Once | INTRAMUSCULAR | Status: AC
  Administered 2014-12-23: 4 mg via INTRAVENOUS
  Filled 2014-12-23: qty 1

## 2014-12-23 MED ORDER — IOHEXOL 300 MG/ML  SOLN
100.0000 mL | Freq: Once | INTRAMUSCULAR | Status: AC | PRN
  Administered 2014-12-23: 100 mL via INTRAVENOUS

## 2014-12-23 MED ORDER — TETANUS-DIPHTHERIA TOXOIDS TD 5-2 LFU IM INJ
0.5000 mL | INJECTION | Freq: Once | INTRAMUSCULAR | Status: DC
  Filled 2014-12-23: qty 0.5

## 2014-12-23 MED ORDER — TETANUS-DIPHTH-ACELL PERTUSSIS 5-2.5-18.5 LF-MCG/0.5 IM SUSP
0.5000 mL | Freq: Once | INTRAMUSCULAR | Status: AC
  Administered 2014-12-24: 0.5 mL via INTRAMUSCULAR
  Filled 2014-12-23: qty 0.5
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
## 2014-12-23 NOTE — ED Notes (Signed)
Patient transported to CT 
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
## 2014-12-24 ENCOUNTER — Encounter (HOSPITAL_COMMUNITY): Payer: Self-pay | Admitting: Emergency Medicine

## 2014-12-24 MED ORDER — NAPROXEN 500 MG PO TABS: 500.0000 mg | ORAL_TABLET | Freq: Two times a day (BID) | ORAL | Status: DC | PRN

## 2014-12-24 MED ORDER — LIDOCAINE-EPINEPHRINE 2 %-1:100000 IJ SOLN: 30.0000 mL | Freq: Once | INTRAMUSCULAR | Status: DC

## 2014-12-24 MED ORDER — LIDOCAINE-EPINEPHRINE (PF) 1 %-1:200000 IJ SOLN
INTRAMUSCULAR | Status: AC
  Administered 2014-12-24: 30 mL
  Filled 2014-12-24: qty 30

## 2014-12-24 MED ORDER — IBUPROFEN 200 MG PO TABS
600.0000 mg | ORAL_TABLET | Freq: Once | ORAL | Status: AC
  Administered 2014-12-24: 600 mg via ORAL
  Filled 2014-12-24: qty 3

## 2014-12-24 NOTE — Discharge Instructions (Signed)
Please see a primary care provider or return to ED in 7-10 days for staple removal. Return for worsening symptoms, including confusion, fevers, or any other symptoms concerning to you.  Stitches, Staples, or Skin Adhesive Strips  Stitches (sutures), staples, and skin adhesive strips hold the skin together as it heals. They will usually be in place for 7 days or less. HOME CARE  Wash your hands with soap and water before and after you touch your wound.  Only take medicine as told by your doctor.  Cover your wound only if your doctor told you to. Otherwise, leave it open to air.  Do not get your stitches wet or dirty. If they get dirty, dab them gently with a clean washcloth. Wet the washcloth with soapy water. Do not rub. Pat them dry gently.  Do not put medicine or medicated cream on your stitches unless your doctor told you to.  Do not take out your own stitches or staples. Skin adhesive strips will fall off by themselves.  Do not pick at the wound. Picking can cause an infection.  Do not miss your follow-up appointment.  If you have problems or questions, call your doctor. GET HELP RIGHT AWAY IF:   You have a temperature by mouth above 102 F (38.9 C), not controlled by medicine.  You have chills.  You have redness or pain around your stitches.  There is puffiness (swelling) around your stitches.  You notice fluid (drainage) from your stitches.  There is a bad smell coming from your wound. MAKE SURE YOU:  Understand these instructions.  Will watch your condition.  Will get help if you are not doing well or get worse. Document Released: 03/01/2009 Document Revised: 07/27/2011 Document Reviewed: 03/01/2009 [facility] Patient Information 2015 [place], [place]. This information is not intended to replace advice given to you by your health care provider. Make sure you discuss any questions you have with your health care provider.

## 2014-12-30 ENCOUNTER — Emergency Department (HOSPITAL_COMMUNITY)
Admission: EM | Admit: 2014-12-30 | Discharge: 2014-12-30 | Disposition: A | Discharge status: Home / Self Care | Payer: Self-pay | Attending: Emergency Medicine | Admitting: Emergency Medicine

## 2014-12-30 ENCOUNTER — Encounter (HOSPITAL_COMMUNITY): Payer: Self-pay | Admitting: Emergency Medicine

## 2014-12-30 DIAGNOSIS — Z79899 Other long term (current) drug therapy: Secondary | ICD-10-CM | POA: Insufficient documentation

## 2014-12-30 DIAGNOSIS — F0781 Postconcussional syndrome: Secondary | ICD-10-CM | POA: Insufficient documentation

## 2014-12-30 DIAGNOSIS — Z720 Tobacco use: Secondary | ICD-10-CM | POA: Insufficient documentation

## 2014-12-30 DIAGNOSIS — Z87828 Personal history of other (healed) physical injury and trauma: Secondary | ICD-10-CM | POA: Insufficient documentation

## 2014-12-30 DIAGNOSIS — G44309 Post-traumatic headache, unspecified, not intractable: Secondary | ICD-10-CM

## 2014-12-30 DIAGNOSIS — I10 Essential (primary) hypertension: Secondary | ICD-10-CM | POA: Insufficient documentation

## 2014-12-30 DIAGNOSIS — R51 Headache: Secondary | ICD-10-CM | POA: Insufficient documentation

## 2014-12-30 MED ORDER — OXYCODONE-ACETAMINOPHEN 5-325 MG PO TABS
1.0000 | ORAL_TABLET | Freq: Once | ORAL | Status: AC
  Administered 2014-12-30: 1 via ORAL
  Filled 2014-12-30: qty 1

## 2014-12-30 MED ORDER — OXYCODONE-ACETAMINOPHEN 5-325 MG PO TABS: 1.0000 | ORAL_TABLET | ORAL | Status: DC | PRN

## 2014-12-30 MED ORDER — KETOROLAC TROMETHAMINE 30 MG/ML IJ SOLN
30.0000 mg | Freq: Once | INTRAMUSCULAR | Status: AC
  Administered 2014-12-30: 30 mg via INTRAVENOUS
  Filled 2014-12-30: qty 1

## 2014-12-30 MED ORDER — IBUPROFEN 800 MG PO TABS: 800.0000 mg | ORAL_TABLET | Freq: Three times a day (TID) | ORAL | Status: DC | PRN

## 2014-12-30 MED ORDER — ONDANSETRON HCL 4 MG/2ML IJ SOLN
4.0000 mg | Freq: Once | INTRAMUSCULAR | Status: AC
  Administered 2014-12-30: 4 mg via INTRAVENOUS
  Filled 2014-12-30: qty 2

## 2014-12-30 MED ORDER — HYDROMORPHONE HCL 1 MG/ML IJ SOLN
1.0000 mg | Freq: Once | INTRAMUSCULAR | Status: AC
  Administered 2014-12-30: 1 mg via INTRAMUSCULAR
  Filled 2014-12-30: qty 1
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
## 2014-12-30 NOTE — Discharge Instructions (Signed)
Head Injury  You have received a head injury. It does not appear serious at this time. Headaches and vomiting are common following head injury. It should be easy to awaken from sleeping. Sometimes it is necessary for you to stay in the emergency department for a while for observation. Sometimes admission to the hospital may be needed. After injuries such as yours, most problems occur within the first 24 hours, but side effects may occur up to 7-10 days after the injury. It is important for you to carefully monitor your condition and contact your health care provider or seek immediate medical care if there is a change in your condition.  WHAT ARE THE TYPES OF HEAD INJURIES?  Head injuries can be as minor as a bump. Some head injuries can be more severe. More severe head injuries include:   A jarring injury to the brain (concussion).   A bruise of the brain (contusion). This mean there is bleeding in the brain that can cause swelling.   A cracked skull (skull fracture).   Bleeding in the brain that collects, clots, and forms a bump (hematoma).  WHAT CAUSES A HEAD INJURY?  A serious head injury is most likely to happen to someone who is in a car wreck and is not wearing a seat belt. Other causes of major head injuries include bicycle or motorcycle accidents, sports injuries, and falls.  HOW ARE HEAD INJURIES DIAGNOSED?  A complete history of the event leading to the injury and your current symptoms will be helpful in diagnosing head injuries. Many times, pictures of the brain, such as CT or MRI are needed to see the extent of the injury. Often, an overnight hospital stay is necessary for observation.   WHEN SHOULD I SEEK IMMEDIATE MEDICAL CARE?   You should get help right away if:   You have confusion or drowsiness.   You feel sick to your stomach (nauseous) or have continued, forceful vomiting.   You have dizziness or unsteadiness that is getting worse.   You have severe, continued headaches not relieved by  medicine. Only take over-the-counter or prescription medicines for pain, fever, or discomfort as directed by your health care provider.   You do not have normal function of the arms or legs or are unable to walk.   You notice changes in the black spots in the center of the colored part of your eye (pupil).   You have a clear or bloody fluid coming from your nose or ears.   You have a loss of vision.  During the next 24 hours after the injury, you must stay with someone who can watch you for the warning signs. This person should contact local emergency services (911 in the [place] if you have seizures, you become unconscious, or you are unable to wake up.  HOW CAN I PREVENT A HEAD INJURY IN THE FUTURE?  The most important factor for preventing major head injuries is avoiding motor vehicle accidents. To minimize the potential for damage to your head, it is crucial to wear seat belts while riding in motor vehicles. Wearing helmets while bike riding and playing collision sports (like football) is also helpful. Also, avoiding dangerous activities around the house will further help reduce your risk of head injury.   WHEN CAN I RETURN TO NORMAL ACTIVITIES AND ATHLETICS?  You should be reevaluated by your health care provider before returning to these activities. If you have any of the following symptoms, you should not return to activities   or contact sports until 1 week after the symptoms have stopped:   Persistent headache.   Dizziness or vertigo.   Poor attention and concentration.   Confusion.   Memory problems.   Nausea or vomiting.   Fatigue or tire easily.   Irritability.   Intolerant of bright lights or loud noises.   Anxiety or depression.   Disturbed sleep.  MAKE SURE YOU:    Understand these instructions.   Will watch your condition.   Will get help right away if you are not doing well or get worse.  Document Released: 05/04/2005 Document Revised: 05/09/2013 Document Reviewed:  01/09/2013  ExitCare Patient Information 2015 [employer]. This information is not intended to replace advice given to you by your health care provider. Make sure you discuss any questions you have with your health care provider.    Post-Concussion Syndrome  Post-concussion syndrome describes the symptoms that can occur after a head injury. These symptoms can last from weeks to months.  CAUSES   It is not clear why some head injuries cause post-concussion syndrome. It can occur whether your head injury was mild or severe and whether you were wearing head protection or not.   SIGNS AND SYMPTOMS   Memory difficulties.   Dizziness.   Headaches.   Double vision or blurry vision.   Sensitivity to light.   Hearing difficulties.   Depression.   Tiredness.   Weakness.   Difficulty with concentration.   Difficulty sleeping or staying asleep.   Vomiting.   Poor balance or instability on your feet.   Slow reaction time.   Difficulty learning and remembering things you have heard.  DIAGNOSIS   There is no test to determine whether you have post-concussion syndrome. Your health care provider may order an imaging scan of your brain, such as a CT scan, to check for other problems that may be causing your symptoms (such as severe injury inside your skull).  TREATMENT   Usually, these problems disappear over time without medical care. Your health care provider may prescribe medicine to help ease your symptoms. It is important to follow up with a neurologist to evaluate your recovery and address any lingering symptoms or issues.  HOME CARE INSTRUCTIONS    Only take over-the-counter or prescription medicines for pain, discomfort, or fever as directed by your health care provider. Do not take aspirin. Aspirin can slow blood clotting.   Sleep with your head slightly elevated to help with headaches.   Avoid any situation where there is potential for another head injury (football, hockey, soccer, basketball, martial  arts, downhill snow sports, and horseback riding). Your condition will get worse every time you experience a concussion. You should avoid these activities until you are evaluated by the appropriate follow-up health care providers.   Keep all follow-up appointments as directed by your health care provider.  SEEK IMMEDIATE MEDICAL CARE IF:   You develop confusion or unusual drowsiness.   You cannot wake the injured person.   You develop nausea or persistent, forceful vomiting.   You feel like you are moving when you are not (vertigo).   You notice the injured person's eyes moving rapidly back and forth. This may be a sign of vertigo.   You have convulsions or faint.   You have severe, persistent headaches that are not relieved by medicine.   You cannot use your arms or legs normally.   Your pupils change size.   You have clear or bloody discharge from   the nose or ears.   Your problems are getting worse, not better.  MAKE SURE YOU:   Understand these instructions.   Will watch your condition.   Will get help right away if you are not doing well or get worse.  Document Released: 10/24/2001 Document Revised: 02/22/2013 Document Reviewed: 08/09/2013  ExitCare Patient Information 2015 [employer]. This information is not intended to replace advice given to you by your health care provider. Make sure you discuss any questions you have with your health care provider.
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
## 2015-10-07 ENCOUNTER — Emergency Department (HOSPITAL_COMMUNITY): Payer: Self-pay

## 2015-10-07 ENCOUNTER — Encounter (HOSPITAL_COMMUNITY): Payer: Self-pay | Admitting: Emergency Medicine

## 2015-10-07 ENCOUNTER — Inpatient Hospital Stay (HOSPITAL_COMMUNITY)
Admission: EM | Admit: 2015-10-07 | Discharge: 2015-10-11 | DRG: 603 | Disposition: A | Discharge status: Home / Self Care | Payer: Self-pay | Attending: Internal Medicine | Admitting: Internal Medicine

## 2015-10-07 DIAGNOSIS — L0291 Cutaneous abscess, unspecified: Secondary | ICD-10-CM | POA: Diagnosis present

## 2015-10-07 DIAGNOSIS — F17210 Nicotine dependence, cigarettes, uncomplicated: Secondary | ICD-10-CM | POA: Diagnosis present

## 2015-10-07 DIAGNOSIS — G4700 Insomnia, unspecified: Secondary | ICD-10-CM | POA: Diagnosis present

## 2015-10-07 DIAGNOSIS — F1990 Other psychoactive substance use, unspecified, uncomplicated: Secondary | ICD-10-CM | POA: Diagnosis present

## 2015-10-07 DIAGNOSIS — L03113 Cellulitis of right upper limb: Secondary | ICD-10-CM | POA: Diagnosis present

## 2015-10-07 DIAGNOSIS — Z809 Family history of malignant neoplasm, unspecified: Secondary | ICD-10-CM

## 2015-10-07 DIAGNOSIS — Z825 Family history of asthma and other chronic lower respiratory diseases: Secondary | ICD-10-CM

## 2015-10-07 DIAGNOSIS — L0390 Cellulitis, unspecified: Secondary | ICD-10-CM

## 2015-10-07 DIAGNOSIS — R609 Edema, unspecified: Secondary | ICD-10-CM

## 2015-10-07 DIAGNOSIS — L02511 Cutaneous abscess of right hand: Principal | ICD-10-CM | POA: Diagnosis present

## 2015-10-07 DIAGNOSIS — I10 Essential (primary) hypertension: Secondary | ICD-10-CM | POA: Diagnosis present

## 2015-10-07 DIAGNOSIS — N400 Enlarged prostate without lower urinary tract symptoms: Secondary | ICD-10-CM | POA: Diagnosis present

## 2015-10-07 DIAGNOSIS — L02413 Cutaneous abscess of right upper limb: Secondary | ICD-10-CM | POA: Diagnosis present

## 2015-10-07 DIAGNOSIS — Z823 Family history of stroke: Secondary | ICD-10-CM

## 2015-10-07 DIAGNOSIS — Z720 Tobacco use: Secondary | ICD-10-CM | POA: Diagnosis present

## 2015-10-07 DIAGNOSIS — Z8249 Family history of ischemic heart disease and other diseases of the circulatory system: Secondary | ICD-10-CM

## 2015-10-07 DIAGNOSIS — F1910 Other psychoactive substance abuse, uncomplicated: Secondary | ICD-10-CM | POA: Insufficient documentation

## 2015-10-07 LAB — BASIC METABOLIC PANEL
ANION GAP: 8 (ref 5–15)
BUN: 9 mg/dL (ref 6–20)
CALCIUM: 8.8 mg/dL — AB (ref 8.9–10.3)
CHLORIDE: 102 mmol/L (ref 101–111)
CO2: 26 mmol/L (ref 22–32)
Creatinine, Ser: 0.85 mg/dL (ref 0.61–1.24)
GFR calc non Af Amer: >60 mL/min (ref >60)
GLUCOSE: 143 mg/dL — AB (ref 65–99)
Potassium: 3.8 mmol/L (ref 3.5–5.1)
Sodium: 136 mmol/L (ref 135–145)

## 2015-10-07 LAB — CBC WITH DIFFERENTIAL/PLATELET
BASOS ABS: 0 10*3/uL (ref 0.0–0.1)
BASOS PCT: 0 %
EOS PCT: 1 %
Eosinophils Absolute: 0.1 10*3/uL (ref 0.0–0.7)
HEMATOCRIT: 43.9 % (ref 39.0–52.0)
HEMOGLOBIN: 15.2 g/dL (ref 13.0–17.0)
Lymphocytes Relative: 10 %
Lymphs Abs: 1.3 10*3/uL (ref 0.7–4.0)
MCH: 30.2 pg (ref 26.0–34.0)
MCHC: 34.6 g/dL (ref 30.0–36.0)
MCV: 87.3 fL (ref 78.0–100.0)
MONO ABS: 1 10*3/uL (ref 0.1–1.0)
Monocytes Relative: 8 %
Neutro Abs: 10.1 10*3/uL — ABNORMAL HIGH (ref 1.7–7.7)
Neutrophils Relative %: 81 %
Platelets: 236 10*3/uL (ref 150–400)
RBC: 5.03 MIL/uL (ref 4.22–5.81)
RDW: 14.6 % (ref 11.5–15.5)
WBC: 12.5 10*3/uL — ABNORMAL HIGH (ref 4.0–10.5)

## 2015-10-07 LAB — CBC
HCT: 41.5 % (ref 39.0–52.0)
HEMOGLOBIN: 13.6 g/dL (ref 13.0–17.0)
MCH: 29 pg (ref 26.0–34.0)
MCHC: 32.8 g/dL (ref 30.0–36.0)
MCV: 88.5 fL (ref 78.0–100.0)
Platelets: 244 10*3/uL (ref 150–400)
RBC: 4.69 MIL/uL (ref 4.22–5.81)
RDW: 14.2 % (ref 11.5–15.5)
WBC: 9.6 10*3/uL (ref 4.0–10.5)

## 2015-10-07 LAB — CREATININE, SERUM
CREATININE: 0.89 mg/dL (ref 0.61–1.24)
GFR calc Af Amer: >60 mL/min (ref >60)
GFR calc non Af Amer: >60 mL/min (ref >60)

## 2015-10-07 LAB — HEPATIC FUNCTION PANEL
ALBUMIN: 3.8 g/dL (ref 3.5–5.0)
ALT: 12 U/L — ABNORMAL LOW (ref 17–63)
AST: 18 U/L (ref 15–41)
Alkaline Phosphatase: 59 U/L (ref 38–126)
BILIRUBIN DIRECT: 0.1 mg/dL (ref 0.1–0.5)
Indirect Bilirubin: 0.4 mg/dL (ref 0.3–0.9)
Total Bilirubin: 0.5 mg/dL (ref 0.3–1.2)
Total Protein: 6.9 g/dL (ref 6.5–8.1)

## 2015-10-07 LAB — MAGNESIUM: MAGNESIUM: 1.9 mg/dL (ref 1.7–2.4)

## 2015-10-07 LAB — PHOSPHORUS: PHOSPHORUS: 3.1 mg/dL (ref 2.5–4.6)

## 2015-10-07 MED ORDER — SODIUM CHLORIDE 0.9 % IV SOLN
INTRAVENOUS | Status: DC
  Administered 2015-10-07: 12:00:00 via INTRAVENOUS

## 2015-10-07 MED ORDER — PIPERACILLIN-TAZOBACTAM 3.375 G IVPB
3.3750 g | Freq: Three times a day (TID) | INTRAVENOUS | Status: DC
  Administered 2015-10-07 – 2015-10-11 (×11): 3.375 g via INTRAVENOUS
  Filled 2015-10-07 (×13): qty 50

## 2015-10-07 MED ORDER — ONDANSETRON HCL 4 MG PO TABS: 4.0000 mg | ORAL_TABLET | Freq: Four times a day (QID) | ORAL | Status: DC | PRN

## 2015-10-07 MED ORDER — HEPARIN SODIUM (PORCINE) 5000 UNIT/ML IJ SOLN
5000.0000 [IU] | Freq: Three times a day (TID) | INTRAMUSCULAR | Status: DC
  Administered 2015-10-07 – 2015-10-11 (×9): 5000 [IU] via SUBCUTANEOUS
  Filled 2015-10-07 (×9): qty 1

## 2015-10-07 MED ORDER — TAMSULOSIN HCL 0.4 MG PO CAPS
0.4000 mg | ORAL_CAPSULE | Freq: Every day | ORAL | Status: DC
  Administered 2015-10-08 – 2015-10-11 (×4): 0.4 mg via ORAL
  Filled 2015-10-07 (×4): qty 1

## 2015-10-07 MED ORDER — ACETAMINOPHEN 650 MG RE SUPP: 650.0000 mg | Freq: Four times a day (QID) | RECTAL | Status: DC | PRN

## 2015-10-07 MED ORDER — VANCOMYCIN HCL 10 G IV SOLR
1500.0000 mg | Freq: Two times a day (BID) | INTRAVENOUS | Status: DC
  Administered 2015-10-08 – 2015-10-10 (×7): 1500 mg via INTRAVENOUS
  Filled 2015-10-07 (×8): qty 1500

## 2015-10-07 MED ORDER — PIPERACILLIN-TAZOBACTAM 3.375 G IVPB 30 MIN: 3.3750 g | Freq: Once | INTRAVENOUS | Status: DC

## 2015-10-07 MED ORDER — AMLODIPINE BESYLATE 5 MG PO TABS
5.0000 mg | ORAL_TABLET | Freq: Every day | ORAL | Status: DC
  Administered 2015-10-07 – 2015-10-11 (×5): 5 mg via ORAL
  Filled 2015-10-07 (×5): qty 1

## 2015-10-07 MED ORDER — ACETAMINOPHEN 325 MG PO TABS
650.0000 mg | ORAL_TABLET | Freq: Four times a day (QID) | ORAL | Status: DC | PRN
  Administered 2015-10-08: 650 mg via ORAL
  Filled 2015-10-07: qty 2

## 2015-10-07 MED ORDER — HYDRALAZINE HCL 20 MG/ML IJ SOLN
10.0000 mg | Freq: Four times a day (QID) | INTRAMUSCULAR | Status: DC | PRN
Start: 2015-10-07 — End: 2015-10-11

## 2015-10-07 MED ORDER — ONDANSETRON HCL 4 MG/2ML IJ SOLN: 4.0000 mg | Freq: Four times a day (QID) | INTRAMUSCULAR | Status: DC | PRN

## 2015-10-07 MED ORDER — VANCOMYCIN HCL IN DEXTROSE 1-5 GM/200ML-% IV SOLN
1000.0000 mg | INTRAVENOUS | Status: AC
  Administered 2015-10-07: 1000 mg via INTRAVENOUS
  Filled 2015-10-07: qty 200

## 2015-10-07 MED ORDER — OXYCODONE HCL 5 MG PO TABS
5.0000 mg | ORAL_TABLET | ORAL | Status: DC | PRN
Start: 2015-10-07 — End: 2015-10-11
  Administered 2015-10-07 – 2015-10-11 (×19): 5 mg via ORAL
  Filled 2015-10-07 (×19): qty 1

## 2015-10-07 MED ORDER — SODIUM CHLORIDE 0.9 % IV SOLN
INTRAVENOUS | Status: DC
  Administered 2015-10-07 – 2015-10-10 (×5): via INTRAVENOUS

## 2015-10-07 MED ORDER — HYDROMORPHONE HCL 1 MG/ML IJ SOLN
1.0000 mg | Freq: Once | INTRAMUSCULAR | Status: AC
  Administered 2015-10-07: 1 mg via INTRAVENOUS
  Filled 2015-10-07: qty 1

## 2015-10-07 MED ORDER — PIPERACILLIN-TAZOBACTAM 3.375 G IVPB
3.3750 g | Freq: Once | INTRAVENOUS | Status: AC
  Administered 2015-10-07: 3.375 g via INTRAVENOUS
  Filled 2015-10-07: qty 50

## 2015-10-07 MED ORDER — ZOLPIDEM TARTRATE 5 MG PO TABS
5.0000 mg | ORAL_TABLET | Freq: Every evening | ORAL | Status: DC | PRN
Start: 2015-10-07 — End: 2015-10-11
  Administered 2015-10-09 – 2015-10-10 (×2): 5 mg via ORAL
  Filled 2015-10-07 (×2): qty 1

## 2015-10-07 MED ORDER — KETOROLAC TROMETHAMINE 30 MG/ML IJ SOLN
30.0000 mg | Freq: Once | INTRAMUSCULAR | Status: AC
  Administered 2015-10-07: 30 mg via INTRAVENOUS
  Filled 2015-10-07: qty 1

## 2015-10-07 MED ORDER — NICOTINE 21 MG/24HR TD PT24
21.0000 mg | MEDICATED_PATCH | Freq: Every day | TRANSDERMAL | Status: DC
  Administered 2015-10-07 – 2015-10-11 (×5): 21 mg via TRANSDERMAL
  Filled 2015-10-07 (×5): qty 1

## 2015-10-07 NOTE — Progress Notes (Addendum)
Pharmacy Antibiotic Note  Aaron Roberson is a 45 y.o. male with PMHx IVDU, last injection in right arm yesterday, admitted on 10/07/2015 with right arm pain and swelling.  Xray negative for fracture or subluxation.  Ultrasound shows generalized soft tissue edema concerning for cellulitis.  Pharmacy has been consulted for Vancomycin dosing.  Zosyn x 1 ordered in ED. NKDA.  Plan: Vancomycin 1g IV x1.  F/u renal function and weight for maintenance doses.    Update:  SCr = 0.85, CrCl >100 CG/N Start Vancomycin 1500mg  q12h Check VT at Css  VT goal = 10-15 mcg/ml    Temp (24hrs), Avg:97.9 F (36.6 C), Min:97.9 F (36.6 C), Max:97.9 F (36.6 C)   Recent Labs Lab 10/07/15 1225  WBC 12.5*    CrCl cannot be calculated (Unknown ideal weight.).    No Known Allergies  Antimicrobials this admission: 5/22 Vancomycin >>  5/22 Zosyn x1  Dose adjustments this admission: Previous vancomycin trough = 4.5 mcg/ml on 07/02/14 (SCr ~0.7-0.8) on 1g q12h  Microbiology results: 5/22 BCx: collected  Thank you for allowing pharmacy to be a part of this patient's care.  Aaron Roberson, PharmD, BCPS 10/07/2015, 2:43 PM  Pager: [phone]

## 2015-10-07 NOTE — ED Provider Notes (Addendum)
CSN: [id number]     Arrival date & time 10/07/15  4098 History   First MD Initiated Contact with Patient 10/07/15 1137     Chief Complaint  Patient presents with  . Arm Swelling   HPI Pt complains Of pain and swelling in his right arm that started when he woke up this morning. Patient states he felt fine yesterday. Patient does admit that he uses intravenous drugs. He injected into his arm yesterday.  This morning he woke up with swelling in the right forearm wrist and elbow. The pain is severe. It increases whenever he moves his arm. He denies any fevers or chills. No chest pain or shortness of breath. Past Medical History  Diagnosis Date  . Hypertension    Past Surgical History  Procedure Laterality Date  . Knee surgery      x 3  . Cyst removals     Family History  Problem Relation Age of Onset  . COPD Mother   . Stroke Mother   . Hypertension Mother   . Cancer Father     prostate  . Hypertension Father   . Arthritis Maternal Grandfather   . Cancer Paternal Grandmother     breast   Social History  Substance Use Topics  . Smoking status: Current Every Day Smoker -- 0.50 packs/day    Types: Cigarettes  . Smokeless tobacco: None  . Alcohol Use: Yes    Review of Systems  All other systems reviewed and are negative.     Allergies  Review of patient's allergies indicates no known allergies.  Home Medications   Prior to Admission medications   Medication Sig Start Date End Date Taking? Authorizing Provider  diphenhydramine-acetaminophen (TYLENOL PM) 25-500 MG TABS tablet Take 1 tablet by mouth at bedtime as needed (for sleep).   Yes Historical Provider, MD  tamsulosin (FLOMAX) 0.4 MG CAPS capsule Take 1 capsule (0.4 mg total) by mouth daily. Patient not taking: Reported on 10/07/2015 07/05/14   Ambrose Finland, NP   BP 169/99 mmHg  Pulse 89  Temp(Src) 97.9 F (36.6 C) (Oral)  Resp 20  SpO2 100% Physical Exam  Constitutional: He appears well-developed and  well-nourished. No distress.  HENT:  Head: Normocephalic and atraumatic.  Right Ear: External ear normal.  Left Ear: External ear normal.  Eyes: Conjunctivae are normal. Right eye exhibits no discharge. Left eye exhibits no discharge. No scleral icterus.  Neck: Neck supple. No tracheal deviation present.  Cardiovascular: Normal rate, regular rhythm and intact distal pulses.   Pulmonary/Chest: Effort normal and breath sounds normal. No stridor. No respiratory distress. He has no wheezes. He has no rales.  Abdominal: Soft. Bowel sounds are normal. He exhibits no distension. There is no tenderness. There is no rebound and no guarding.  Musculoskeletal: He exhibits edema and tenderness.       Right elbow: He exhibits normal range of motion, no swelling, no effusion and no deformity.       Right wrist: He exhibits decreased range of motion, tenderness and swelling.       Right forearm: He exhibits tenderness, swelling and edema.       Right hand: He exhibits decreased range of motion, tenderness and swelling. He exhibits no deformity and no laceration. Normal sensation noted. Normal strength noted.  No erythema noted of the forearm, no lymphangitic streaking, tenderness to palpation of the soft tissues of the left forearm as well as left wrist, edema noted, evidence of IV track marks associated  with intravenous drug abuse  Neurological: He is alert. He has normal strength. No cranial nerve deficit (no facial droop, extraocular movements intact, no slurred speech) or sensory deficit. He exhibits normal muscle tone. He displays no seizure activity. Coordination normal.  Skin: Skin is warm and dry. No rash noted.  Psychiatric: He has a normal mood and affect.  Nursing note and vitals reviewed.   ED Course  Procedures (including critical care time) Labs Review Labs Reviewed  CBC WITH DIFFERENTIAL/PLATELET - Abnormal; Notable for the following:    WBC 12.5 (*)    Neutro Abs 10.1 (*)    All other  components within normal limits  CULTURE, BLOOD (ROUTINE X 2)  CULTURE, BLOOD (ROUTINE X 2)  BASIC METABOLIC PANEL    Imaging Review Dg Forearm Right  10/07/2015  CLINICAL DATA:  Right wrist pain right forearm pain starting this morning, no known injury EXAM: RIGHT FOREARM - 2 VIEW COMPARISON:  None. FINDINGS: Three views of the right forearm submitted. No acute fracture or subluxation IMPRESSION: Negative. Electronically Signed   By: Natasha MeadLiviu  Pop M.D.   On: 10/07/2015 12:15   Dg Wrist Complete Right  10/07/2015  CLINICAL DATA:  Right wrist pain starting this morning EXAM: RIGHT WRIST - COMPLETE 3+ VIEW COMPARISON:  06/14/2007 FINDINGS: Three views of the right wrist submitted. No acute fracture or subluxation. Mild narrowing of radiocarpal joint space. Degenerative changes are noted at the articulation of scaphoid with trapezium and trapezoid bone. Mild degenerative changes first carpometacarpal joint. Mild cystic degenerative changes anterior aspect of the scaphoid IMPRESSION: No acute fracture or subluxation. Degenerative changes as described above. Electronically Signed   By: Natasha MeadLiviu  Pop M.D.   On: 10/07/2015 12:19   [place] Extrem Up Right Ltd  10/07/2015  CLINICAL DATA:  Right wrist swelling. EXAM: ULTRASOUND RIGHT UPPER EXTREMITY LIMITED TECHNIQUE: Ultrasound examination of the upper extremity soft tissues was performed in the area of clinical concern. COMPARISON:  NONE FINDINGS: Generalized soft tissue edema along the dorsal aspect of the wrist and hand. 1.3 x 1.9 x 0.6 cm fluid collection along the dorsal aspect of the wrist concerning for a large joint effusion or ganglion cyst No other solid or cystic mass. IMPRESSION: Generalized soft tissue edema along the dorsal aspect of the wrist and hand concerning for cellulitis. 1.3 x 1.9 x 0.6 cm fluid collection along the dorsal aspect of the wrist concerning for a large joint effusion or ganglion cyst. If there is further clinical concern, recommend  evaluation with MRI. Electronically Signed   By: Elige KoHetal  Patel   On: 10/07/2015 14:02   I have personally reviewed and evaluated these images and lab results as part of my medical decision-making.  Medications  0.9 %  sodium chloride infusion ( Intravenous New Bag/Given 10/07/15 1226)  piperacillin-tazobactam (ZOSYN) IVPB 3.375 g (not administered)  HYDROmorphone (DILAUDID) injection 1 mg (1 mg Intravenous Given 10/07/15 1226)     MDM   Final diagnoses:  Intravenous drug abuse, episodic  Cellulitis of right upper extremity    Discussed with Dr Mina MarbleWeingold.  Recommends medical admission, iv abx.  May not require I &D.  Will consult with medical service for admission to [facility].  He will follow along with patient.  I will add on hand xray.  Discussed findings with patient   Linwood DibblesJon Billiejo Sorto, MD 10/07/15 1414  Linwood DibblesJon Kamdin Follett, MD 10/07/15 [phone]

## 2015-10-07 NOTE — ED Notes (Signed)
Attempted to call report a second time. RN stated she would have to call back. RN hung up before this RN was able to give call back number.
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
## 2015-10-07 NOTE — ED Notes (Signed)
Attempted to call report. No one answering phones.

## 2015-10-07 NOTE — ED Notes (Signed)
US at bedside

## 2015-10-07 NOTE — ED Notes (Signed)
Patient transported to X-ray 

## 2015-10-07 NOTE — ED Notes (Signed)
Patient has restricted Right arm and Fluids running left, Will have to try for 2nd culture after fluids.

## 2015-10-07 NOTE — ED Notes (Signed)
Main lab coming to [facility]

## 2015-10-07 NOTE — Progress Notes (Signed)
[facility]. Alert and oriented x4. Oriented to room and call bell. Right arm swelling, elevated on pillows. Pain 8/10.

## 2015-10-07 NOTE — H&P (Signed)
History and Physical    Aaron Roberson:[id number] DOB: May 20, 1970 DOA: 10/07/2015  Referring Provider: Dr. Lynelle Doctor PCP: Kaleen Mask, MD   Patient coming from: Home  Chief Complaint: RUE swelling, pain and erythema  HPI: Aaron Roberson is a 45 y.o. male with PMH significant for IV drug abuse, tobacco abuse, insomnia, HTN and BPH; who presented to ED secondary to swelling, pain and erythema affecting his right forearm and hand. Patient reports using some injectable heroin in that arm the day PTA; when he woke up notice the signigicant swelling and pain. Patient though he slept on his arm initially; but swelling progresses more and redness intensified along with pain. He denies fever, chills, CP, SOB, nausea, vomiting, abd pain, dysuria, hematuria or any other complaints.   ED Course: patient blood cx's taken; IVF given and initiated on antibiotics. Hand surgeon consulted and recommendations were given to transfer to [facility]. TRH consulted.  Review of Systems:  All other systems reviewed and apart from HPI, are negative.  Past Medical History  Diagnosis Date  . Hypertension     Past Surgical History  Procedure Laterality Date  . Knee surgery      x 3  . Cyst removals       reports that he has been smoking Cigarettes.  He has been smoking about 0.50 packs per day. He does not have any smokeless tobacco history on file. He reports that he drinks alcohol. He reports that he uses illicit drugs (IV).  No Known Allergies  Family History  Problem Relation Age of Onset  . COPD Mother   . Stroke Mother   . Hypertension Mother   . Cancer Father     prostate  . Hypertension Father   . Arthritis Maternal Grandfather   . Cancer Paternal Grandmother     breast     Prior to Admission medications   Medication Sig Start Date End Date Taking? Authorizing Provider  diphenhydramine-acetaminophen (TYLENOL PM) 25-500 MG TABS tablet Take 1 tablet by mouth at bedtime as needed (for  sleep).   Yes Historical Provider, MD  tamsulosin (FLOMAX) 0.4 MG CAPS capsule Take 1 capsule (0.4 mg total) by mouth daily. Patient not taking: Reported on 10/07/2015 07/05/14   Ambrose Finland, NP    Physical Exam: Filed Vitals:   10/07/15 1024 10/07/15 1235 10/07/15 1523 10/07/15 1544  BP: 151/112 169/99 161/100   Pulse: 103 89 78   Temp: 97.9 F (36.6 C)  97.7 F (36.5 C)   TempSrc: Oral  Oral   Resp:  20 20   Height:    6' (1.829 m)  Weight:    74.844 kg (165 lb)  SpO2: 100% 100% 96%     Constitutional: mild distress due to pain on his RUE; no fever, no CP, no SOB. Oriented X3 and able to follow commands. Eyes: PERRLA, lids and conjunctivae normal ENMT: Mucous membranes slightly dry on exam; positive halitosis. Very poor dentition. No thrush   Neck: normal, supple, no masses, no thyromegaly, no JVD Respiratory: clear to auscultation bilaterally, no wheezing, no crackles. Normal respiratory effort. No accessory muscle use.  Cardiovascular: S1 & S2 heard, regular rate and rhythm, no murmurs / rubs / gallops. No extremity edema. 2+ pedal pulses. No carotid bruits.  Abdomen: No distension, no tenderness, no masses palpated. No hepatosplenomegaly. Bowel sounds normal.  Musculoskeletal: no clubbing or cyanosis; LE w/o edema.   Skin: swelling and erythema affecting RUE; significant pain and decrease range of motion  Neurologic:  CN 2-12 grossly intact. Sensation intact, except for mild decrease sensation to light tough on his right forearm. DTR normal. MS 5/5 in LUE and LE bilaterally; 3/5 on his RUE due to swelling and pain.all 4 limbs.  Psychiatric: Normal judgment and insight. Alert and oriented x 3. Normal mood.     Labs on Admission: I have personally reviewed following labs and imaging studies  CBC:  Recent Labs Lab 10/07/15 1225  WBC 12.5*  NEUTROABS 10.1*  HGB 15.2  HCT 43.9  MCV 87.3  PLT 236   Basic Metabolic Panel:  Recent Labs Lab 10/07/15 1526  NA 136  K  3.8  CL 102  CO2 26  GLUCOSE 143*  BUN 9  CREATININE 0.85  CALCIUM 8.8*   GFR: Estimated Creatinine Clearance: 118.6 mL/min (by C-G formula based on Cr of 0.85).  Cardiac Enzymes: No results for input(s): CKTOTAL, CKMB, CKMBINDEX, TROPONINI in the last 168 hours.  Urine analysis:    Component Value Date/Time   COLORURINE YELLOW 06/30/2014 1950   APPEARANCEUR CLEAR 06/30/2014 1950   LABSPEC 1.020 06/30/2014 1950   PHURINE 5.5 06/30/2014 1950   GLUCOSEU NEGATIVE 06/30/2014 1950   HGBUR NEGATIVE 06/30/2014 1950   BILIRUBINUR NEGATIVE 06/30/2014 1950   KETONESUR NEGATIVE 06/30/2014 1950   PROTEINUR NEGATIVE 06/30/2014 1950   UROBILINOGEN 1.0 06/30/2014 1950   NITRITE NEGATIVE 06/30/2014 1950   LEUKOCYTESUR NEGATIVE 06/30/2014 1950    Radiological Exams on Admission: Dg Forearm Right  10/07/2015  CLINICAL DATA:  Right wrist pain right forearm pain starting this morning, no known injury EXAM: RIGHT FOREARM - 2 VIEW COMPARISON:  None. FINDINGS: Three views of the right forearm submitted. No acute fracture or subluxation IMPRESSION: Negative. Electronically Signed   By: Natasha Mead M.D.   On: 10/07/2015 12:15   Dg Wrist Complete Right  10/07/2015  CLINICAL DATA:  Right wrist pain starting this morning EXAM: RIGHT WRIST - COMPLETE 3+ VIEW COMPARISON:  06/14/2007 FINDINGS: Three views of the right wrist submitted. No acute fracture or subluxation. Mild narrowing of radiocarpal joint space. Degenerative changes are noted at the articulation of scaphoid with trapezium and trapezoid bone. Mild degenerative changes first carpometacarpal joint. Mild cystic degenerative changes anterior aspect of the scaphoid IMPRESSION: No acute fracture or subluxation. Degenerative changes as described above. Electronically Signed   By: Natasha Mead M.D.   On: 10/07/2015 12:19   [place] Extrem Up Right Ltd  10/07/2015  CLINICAL DATA:  Right wrist swelling. EXAM: ULTRASOUND RIGHT UPPER EXTREMITY LIMITED TECHNIQUE:  Ultrasound examination of the upper extremity soft tissues was performed in the area of clinical concern. COMPARISON:  NONE FINDINGS: Generalized soft tissue edema along the dorsal aspect of the wrist and hand. 1.3 x 1.9 x 0.6 cm fluid collection along the dorsal aspect of the wrist concerning for a large joint effusion or ganglion cyst No other solid or cystic mass. IMPRESSION: Generalized soft tissue edema along the dorsal aspect of the wrist and hand concerning for cellulitis. 1.3 x 1.9 x 0.6 cm fluid collection along the dorsal aspect of the wrist concerning for a large joint effusion or ganglion cyst. If there is further clinical concern, recommend evaluation with MRI. Electronically Signed   By: Elige Ko   On: 10/07/2015 14:02   Dg Hand Complete Right  10/07/2015  CLINICAL DATA:  Acute right hand pain and swelling without known injury. Initial encounter. EXAM: RIGHT HAND - COMPLETE 3+ VIEW COMPARISON:  None. FINDINGS: There is no evidence of fracture  or dislocation. There is no evidence of arthropathy or other focal bone abnormality. Soft tissues are unremarkable. IMPRESSION: Normal right hand. Electronically Signed   By: Lupita RaiderJames  Green Jr, M.D.   On: 10/07/2015 14:50    EKG:  None   Assessment/Plan 1-Abscess and cellulitis: affecting right forearm, wrist and hand -patient with hx of IV drug use (heroin); admitted he use it the night before developing cellulitis -significant swelling and inability to move arm; reports mild numbness  -case discussed with hand surgeon and recommendations given was to start broad spectrum antibiotics and [facility] -will keep arm elevated, provide IVF's and PRN analgesics -will follow cellulitis orders set and will check HIV and blood cx's  2-Insomnia: PRN zolpidem will be provided  3-HTN (hypertension): -uncontrolled currently; but pain contributing -will use amlodipine and PRN hydralazine -follow VS and adjust medications as needed  4-Tobacco  abuse: -cessation counseling provided -nicotine patch ordered  5-IV drug user: heroin -cessation counseling provided -patient expressed that is not a problem and that uses it sporadically  6-BPH (benign prostatic hyperplasia): -will continue flomax    Time: 60 minutes   DVT prophylaxis: heparin Code Status: full code Family Communication: friend at bedside Disposition Plan: home when medically stable Consults called: Dr. Mina MarbleWeingold (hand surgery)  Admission status: inpatient, LOS > 2 midnights, med-surg    Vassie LollMadera, Estes Lehner MD [facility] Pager [phone]  If 7PM-7AM, please contact night-coverage [email or web address] Password [email or web address]  10/07/2015, 6:13 PM

## 2015-10-07 NOTE — Progress Notes (Signed)
Pharmacy Antibiotic Note  Aaron Roberson is a 45 y.o. male with history of IVDU admitted on 10/07/2015 with right arm pain and swelling.  Pharmacy has been consulted for vancomycin and Zosyn dosing for possible cellulitis.  Noted patient has received vancomycin 1gm around 1600 and Zosyn 3.375gm around 1500 today.  He has excellent renal clearance.   Plan: - Zosyn 3.375gm IV Q8H, start at 2300 - Continue vanc 1500mg  IV Q12H as previously ordered - Monitor renal fxn, clinical progress, vanc trough at Css   Height: 6' (182.9 cm) Weight: 165 lb (74.844 kg) IBW/kg (Calculated) : 77.6  Temp (24hrs), Avg:97.8 F (36.6 C), Min:97.7 F (36.5 C), Max:97.9 F (36.6 C)   Recent Labs Lab 10/07/15 1225 10/07/15 1526  WBC 12.5*  --   CREATININE  --  0.85    Estimated Creatinine Clearance: 118.6 mL/min (by C-G formula based on Cr of 0.85).    No Known Allergies  Antimicrobials this admission: Vanc 5/22 >> Zosyn 5/22 >>  Dose adjustments this admission: N/A  Microbiology results: 5/22 BCx x2 -    Chelsea Aushuy D. Laney Potashang, PharmD, BCPS Pager:  [phone] 10/07/2015, 7:24 PM

## 2015-10-08 DIAGNOSIS — Z720 Tobacco use: Secondary | ICD-10-CM

## 2015-10-08 DIAGNOSIS — I10 Essential (primary) hypertension: Secondary | ICD-10-CM

## 2015-10-08 DIAGNOSIS — L0291 Cutaneous abscess, unspecified: Secondary | ICD-10-CM

## 2015-10-08 DIAGNOSIS — L0390 Cellulitis, unspecified: Secondary | ICD-10-CM

## 2015-10-08 DIAGNOSIS — F1990 Other psychoactive substance use, unspecified, uncomplicated: Secondary | ICD-10-CM

## 2015-10-08 LAB — BASIC METABOLIC PANEL
Anion gap: 9 (ref 5–15)
BUN: 6 mg/dL (ref 6–20)
CHLORIDE: 104 mmol/L (ref 101–111)
CO2: 24 mmol/L (ref 22–32)
CREATININE: 0.84 mg/dL (ref 0.61–1.24)
Calcium: 8.9 mg/dL (ref 8.9–10.3)
GFR calc Af Amer: >60 mL/min (ref >60)
GFR calc non Af Amer: >60 mL/min (ref >60)
Glucose, Bld: 121 mg/dL — ABNORMAL HIGH (ref 65–99)
Potassium: 3.8 mmol/L (ref 3.5–5.1)
Sodium: 137 mmol/L (ref 135–145)

## 2015-10-08 LAB — HIV ANTIBODY (ROUTINE TESTING W REFLEX): HIV Screen 4th Generation wRfx: NONREACTIVE

## 2015-10-08 LAB — CBC
HCT: 43 % (ref 39.0–52.0)
Hemoglobin: 13.9 g/dL (ref 13.0–17.0)
MCH: 28.6 pg (ref 26.0–34.0)
MCHC: 32.3 g/dL (ref 30.0–36.0)
MCV: 88.5 fL (ref 78.0–100.0)
PLATELETS: 233 10*3/uL (ref 150–400)
RBC: 4.86 MIL/uL (ref 4.22–5.81)
RDW: 14.3 % (ref 11.5–15.5)
WBC: 8.3 10*3/uL (ref 4.0–10.5)

## 2015-10-08 NOTE — Consult Note (Signed)
Reason for Consult:right hand swelling Referring Physician: [name] is an 45 y.o. male.  HPI: with h/o IV drug use and infections in the past now with 48 hours of increasing pain and swelling of right hand and wrist  Past Medical History  Diagnosis Date  . Hypertension     Past Surgical History  Procedure Laterality Date  . Knee surgery      x 3  . Cyst removals      Family History  Problem Relation Age of Onset  . COPD Mother   . Stroke Mother   . Hypertension Mother   . Cancer Father     prostate  . Hypertension Father   . Arthritis Maternal Grandfather   . Cancer Paternal Grandmother     breast    Social History:  reports that he has been smoking Cigarettes.  He has been smoking about 0.50 packs per day. He does not have any smokeless tobacco history on file. He reports that he drinks alcohol. He reports that he uses illicit drugs (IV).  Allergies: No Known Allergies  Medications:  Scheduled: . amLODipine  5 mg Oral Daily  . heparin  5,000 Units Subcutaneous Q8H  . nicotine  21 mg Transdermal Daily  . piperacillin-tazobactam (ZOSYN)  IV  3.375 g Intravenous Q8H  . tamsulosin  0.4 mg Oral Daily  . vancomycin  1,500 mg Intravenous Q12H    Results for orders placed or performed during the hospital encounter of 10/07/15 (from the past 48 hour(s))  CBC with Differential     Status: Abnormal   Collection Time: 10/07/15 12:25 PM  Result Value Ref Range   WBC 12.5 (H) 4.0 - 10.5 K/uL   RBC 5.03 4.22 - 5.81 MIL/uL   Hemoglobin 15.2 13.0 - 17.0 g/dL   HCT 43.9 39.0 - 52.0 %   MCV 87.3 78.0 - 100.0 fL   MCH 30.2 26.0 - 34.0 pg   MCHC 34.6 30.0 - 36.0 g/dL   RDW 14.6 11.5 - 15.5 %   Platelets 236 150 - 400 K/uL   Neutrophils Relative % 81 %   Neutro Abs 10.1 (H) 1.7 - 7.7 K/uL   Lymphocytes Relative 10 %   Lymphs Abs 1.3 0.7 - 4.0 K/uL   Monocytes Relative 8 %   Monocytes Absolute 1.0 0.1 - 1.0 K/uL   Eosinophils Relative 1 %   Eosinophils  Absolute 0.1 0.0 - 0.7 K/uL   Basophils Relative 0 %   Basophils Absolute 0.0 0.0 - 0.1 K/uL  Basic metabolic panel     Status: Abnormal   Collection Time: 10/07/15  3:26 PM  Result Value Ref Range   Sodium 136 135 - 145 mmol/L   Potassium 3.8 3.5 - 5.1 mmol/L   Chloride 102 101 - 111 mmol/L   CO2 26 22 - 32 mmol/L   Glucose, Bld 143 (H) 65 - 99 mg/dL   BUN 9 6 - 20 mg/dL   Creatinine, Ser 0.85 0.61 - 1.24 mg/dL   Calcium 8.8 (L) 8.9 - 10.3 mg/dL   GFR calc non Af Amer >60 >60 mL/min   GFR calc Af Amer >60 >60 mL/min    Comment: (NOTE) The eGFR has been calculated using the CKD EPI equation. This calculation has not been validated in all clinical situations. eGFR's persistently <60 mL/min signify possible Chronic Kidney Disease.    Anion gap 8 5 - 15  CBC     Status: None   Collection  Time: 10/07/15  8:08 PM  Result Value Ref Range   WBC 9.6 4.0 - 10.5 K/uL   RBC 4.69 4.22 - 5.81 MIL/uL   Hemoglobin 13.6 13.0 - 17.0 g/dL   HCT 41.5 39.0 - 52.0 %   MCV 88.5 78.0 - 100.0 fL   MCH 29.0 26.0 - 34.0 pg   MCHC 32.8 30.0 - 36.0 g/dL   RDW 14.2 11.5 - 15.5 %   Platelets 244 150 - 400 K/uL  Creatinine, serum     Status: None   Collection Time: 10/07/15  8:08 PM  Result Value Ref Range   Creatinine, Ser 0.89 0.61 - 1.24 mg/dL   GFR calc non Af Amer >60 >60 mL/min   GFR calc Af Amer >60 >60 mL/min    Comment: (NOTE) The eGFR has been calculated using the CKD EPI equation. This calculation has not been validated in all clinical situations. eGFR's persistently <60 mL/min signify possible Chronic Kidney Disease.   Magnesium     Status: None   Collection Time: 10/07/15  8:08 PM  Result Value Ref Range   Magnesium 1.9 1.7 - 2.4 mg/dL  Phosphorus     Status: None   Collection Time: 10/07/15  8:08 PM  Result Value Ref Range   Phosphorus 3.1 2.5 - 4.6 mg/dL  Hepatic function panel     Status: Abnormal   Collection Time: 10/07/15  8:08 PM  Result Value Ref Range   Total Protein  6.9 6.5 - 8.1 g/dL   Albumin 3.8 3.5 - 5.0 g/dL   AST 18 15 - 41 U/L   ALT 12 (L) 17 - 63 U/L   Alkaline Phosphatase 59 38 - 126 U/L   Total Bilirubin 0.5 0.3 - 1.2 mg/dL   Bilirubin, Direct 0.1 0.1 - 0.5 mg/dL   Indirect Bilirubin 0.4 0.3 - 0.9 mg/dL  Basic metabolic panel     Status: Abnormal   Collection Time: 10/08/15  5:27 AM  Result Value Ref Range   Sodium 137 135 - 145 mmol/L   Potassium 3.8 3.5 - 5.1 mmol/L   Chloride 104 101 - 111 mmol/L   CO2 24 22 - 32 mmol/L   Glucose, Bld 121 (H) 65 - 99 mg/dL   BUN 6 6 - 20 mg/dL   Creatinine, Ser 0.84 0.61 - 1.24 mg/dL   Calcium 8.9 8.9 - 10.3 mg/dL   GFR calc non Af Amer >60 >60 mL/min   GFR calc Af Amer >60 >60 mL/min    Comment: (NOTE) The eGFR has been calculated using the CKD EPI equation. This calculation has not been validated in all clinical situations. eGFR's persistently <60 mL/min signify possible Chronic Kidney Disease.    Anion gap 9 5 - 15  CBC     Status: None   Collection Time: 10/08/15  5:27 AM  Result Value Ref Range   WBC 8.3 4.0 - 10.5 K/uL   RBC 4.86 4.22 - 5.81 MIL/uL   Hemoglobin 13.9 13.0 - 17.0 g/dL   HCT 43.0 39.0 - 52.0 %   MCV 88.5 78.0 - 100.0 fL   MCH 28.6 26.0 - 34.0 pg   MCHC 32.3 30.0 - 36.0 g/dL   RDW 14.3 11.5 - 15.5 %   Platelets 233 150 - 400 K/uL    Dg Forearm Right  10/07/2015  CLINICAL DATA:  Right wrist pain right forearm pain starting this morning, no known injury EXAM: RIGHT FOREARM - 2 VIEW COMPARISON:  None. FINDINGS: Three views  of the right forearm submitted. No acute fracture or subluxation IMPRESSION: Negative. Electronically Signed   By: Lahoma Crocker M.D.   On: 10/07/2015 12:15   Dg Wrist Complete Right  10/07/2015  CLINICAL DATA:  Right wrist pain starting this morning EXAM: RIGHT WRIST - COMPLETE 3+ VIEW COMPARISON:  06/14/2007 FINDINGS: Three views of the right wrist submitted. No acute fracture or subluxation. Mild narrowing of radiocarpal joint space. Degenerative  changes are noted at the articulation of scaphoid with trapezium and trapezoid bone. Mild degenerative changes first carpometacarpal joint. Mild cystic degenerative changes anterior aspect of the scaphoid IMPRESSION: No acute fracture or subluxation. Degenerative changes as described above. Electronically Signed   By: Lahoma Crocker M.D.   On: 10/07/2015 12:19   [place] Extrem Up Right Ltd  10/07/2015  CLINICAL DATA:  Right wrist swelling. EXAM: ULTRASOUND RIGHT UPPER EXTREMITY LIMITED TECHNIQUE: Ultrasound examination of the upper extremity soft tissues was performed in the area of clinical concern. COMPARISON:  NONE FINDINGS: Generalized soft tissue edema along the dorsal aspect of the wrist and hand. 1.3 x 1.9 x 0.6 cm fluid collection along the dorsal aspect of the wrist concerning for a large joint effusion or ganglion cyst No other solid or cystic mass. IMPRESSION: Generalized soft tissue edema along the dorsal aspect of the wrist and hand concerning for cellulitis. 1.3 x 1.9 x 0.6 cm fluid collection along the dorsal aspect of the wrist concerning for a large joint effusion or ganglion cyst. If there is further clinical concern, recommend evaluation with MRI. Electronically Signed   By: Kathreen Devoid   On: 10/07/2015 14:02   Dg Hand Complete Right  10/07/2015  CLINICAL DATA:  Acute right hand pain and swelling without known injury. Initial encounter. EXAM: RIGHT HAND - COMPLETE 3+ VIEW COMPARISON:  None. FINDINGS: There is no evidence of fracture or dislocation. There is no evidence of arthropathy or other focal bone abnormality. Soft tissues are unremarkable. IMPRESSION: Normal right hand. Electronically Signed   By: Marijo Conception, M.D.   On: 10/07/2015 14:50    Review of Systems  All other systems reviewed and are negative.  Blood pressure 151/101, pulse 86, temperature 98.2 F (36.8 C), temperature source Oral, resp. rate 18, height 6' (1.829 m), weight 74.844 kg (165 lb), SpO2 99 %. Physical Exam   Constitutional: He is oriented to person, place, and time. He appears well-developed and well-nourished.  HENT:  Head: Normocephalic and atraumatic.  Neck: Normal range of motion.  Cardiovascular: Normal rate.   Respiratory: Effort normal.  Musculoskeletal:       Right hand: He exhibits tenderness and swelling.  Diffuse right hand and wrist dorsal swelling  Forearm compartments soft    Neurological: He is alert and oriented to person, place, and time.  Skin: Skin is warm. There is erythema.  Psychiatric: He has a normal mood and affect. His behavior is normal. Judgment and thought content normal.    Assessment/Plan: As above  WBC normalized with IV abx but still somewhat swollen  Would start strict elevation with iv pole and mission sling   Please make NPO after midnight for possible surgery tomorrow if clinical picture worsens  Tayte Childers A 10/08/2015, 8:53 AM

## 2015-10-08 NOTE — Progress Notes (Signed)
Patient ID: Aaron Roberson, male   DOB: 09/17/1970, 45 y.o.   MRN: [id number]  PROGRESS NOTE    Aaron Roberson  [id number] DOB: 10/29/1970 DOA: 10/07/2015  PCP: Kaleen MaskELKINS,WILSON OLIVER, MD   Brief Narrative:  45 -year-old male with past medical history significant for IV drug abuse, tobacco use, hypertension who presented [facility] with worsening swelling, redness and pain of right forearm and hand started one day prior to this admission. Patient reported using injectable hearing in that arm the day prior to the admission.  Patient was hemodynamically stable on the admission. He was afebrile. His blood work was notable for mild leukocytosis of 12.5 otherwise unremarkable. He was started on vancomycin and Zosyn. Orthopedic surgery has seen him in consultation.  Assessment & Plan:   Principal Problem:   Abscess and cellulitis or the right forearm and hand / leukocytosis - Plain films are negative for acute osseous findings but ultrasound of the right extremity shows 1.3 x 1.9 x 0.6 cm fluid collection along the dorsal aspect of the wrist concerning for a large joint effusion or ganglion cyst - Appreciate orthopedic surgery recommendations and following - Patient is on vancomycin and Zosyn - He will be nothing by mouth after midnight for possible surgery tomorrow if he does not improve by tomorrow - Continue pain management efforts  Active Problems: Essential hypertension - Continue Norvasc 5 mg daily  Tobacco abuse - Nicotine patch ordered  IV drug abuse - Urine drug screen not obtainable at the time of the admission  DVT prophylaxis: Heparin subcutaneous Code Status: full code  Family Communication: No family at the bedside Disposition Plan: Home once cleared by orthopedic surgery   Consultants:   Orthopedic surgery  Procedures:   None  Antimicrobials:   Vancomycin and Zosyn 10/07/2015 -->    Subjective: Reports ongoing pain in the right  hand.  Objective: Filed Vitals:   10/07/15 1839 10/07/15 1841 10/07/15 1942 10/08/15 0542  BP: 173/105 173/105 152/98 151/101  Pulse:  91 78 86  Temp:   99 F (37.2 C) 98.2 F (36.8 C)  TempSrc:   Oral Oral  Resp:  18 18 18   Height:   6' (1.829 m)   Weight:   74.844 kg (165 lb)   SpO2:  98% 99% 99%    Intake/Output Summary (Last 24 hours) at 10/08/15 1157 Last data filed at 10/08/15 1016  Gross per 24 hour  Intake 2837.92 ml  Output   1800 ml  Net 1037.92 ml   Filed Weights   10/07/15 1544 10/07/15 1942  Weight: 74.844 kg (165 lb) 74.844 kg (165 lb)    Examination:  General exam: Appears calm and comfortable  Respiratory system: Clear to auscultation. Respiratory effort normal. Cardiovascular system: S1 & S2 heard, RRR. No JVD, murmurs, rubs, gallops or clicks. No pedal edema. Gastrointestinal system: Abdomen is nondistended, soft and nontender. No organomegaly or masses felt. Normal bowel sounds heard. Central nervous system: Alert and oriented. No focal neurological deficits. Extremities: Right hand swollen, erythematosus, pulses palpable. Skin: No rashes, lesions or ulcers other than right hand swelling and erythema Psychiatry: Judgement and insight appear normal. Mood & affect appropriate.   Data Reviewed: I have personally reviewed following labs and imaging studies  CBC:  Recent Labs Lab 10/07/15 1225 10/07/15 2008 10/08/15 0527  WBC 12.5* 9.6 8.3  NEUTROABS 10.1*  --   --   HGB 15.2 13.6 13.9  HCT 43.9 41.5 43.0  MCV 87.3 88.5 88.5  PLT  236 244 233   Basic Metabolic Panel:  Recent Labs Lab 10/07/15 1526 10/07/15 2008 10/08/15 0527  NA 136  --  137  K 3.8  --  3.8  CL 102  --  104  CO2 26  --  24  GLUCOSE 143*  --  121*  BUN 9  --  6  CREATININE 0.85 0.89 0.84  CALCIUM 8.8*  --  8.9  MG  --  1.9  --   PHOS  --  3.1  --    GFR: Estimated Creatinine Clearance: 120 mL/min (by C-G formula based on Cr of 0.84). Liver Function  Tests:  Recent Labs Lab 10/07/15 2008  AST 18  ALT 12*  ALKPHOS 59  BILITOT 0.5  PROT 6.9  ALBUMIN 3.8   No results for input(s): LIPASE, AMYLASE in the last 168 hours. No results for input(s): AMMONIA in the last 168 hours. Coagulation Profile: No results for input(s): INR, PROTIME in the last 168 hours. Cardiac Enzymes: No results for input(s): CKTOTAL, CKMB, CKMBINDEX, TROPONINI in the last 168 hours. BNP (last 3 results) No results for input(s): PROBNP in the last 8760 hours. HbA1C: No results for input(s): HGBA1C in the last 72 hours. CBG: No results for input(s): GLUCAP in the last 168 hours. Lipid Profile: No results for input(s): CHOL, HDL, LDLCALC, TRIG, CHOLHDL, LDLDIRECT in the last 72 hours. Thyroid Function Tests: No results for input(s): TSH, T4TOTAL, FREET4, T3FREE, THYROIDAB in the last 72 hours. Anemia Panel: No results for input(s): VITAMINB12, FOLATE, FERRITIN, TIBC, IRON, RETICCTPCT in the last 72 hours. Urine analysis:    Component Value Date/Time   COLORURINE YELLOW 06/30/2014 1950   APPEARANCEUR CLEAR 06/30/2014 1950   LABSPEC 1.020 06/30/2014 1950   PHURINE 5.5 06/30/2014 1950   GLUCOSEU NEGATIVE 06/30/2014 1950   HGBUR NEGATIVE 06/30/2014 1950   BILIRUBINUR NEGATIVE 06/30/2014 1950   KETONESUR NEGATIVE 06/30/2014 1950   PROTEINUR NEGATIVE 06/30/2014 1950   UROBILINOGEN 1.0 06/30/2014 1950   NITRITE NEGATIVE 06/30/2014 1950   LEUKOCYTESUR NEGATIVE 06/30/2014 1950   Sepsis Labs: (procalcitonin:4,lacticidven:4)   )No results found for this or any previous visit (from the past 240 hour(s)).    Radiology Studies: Dg Forearm Right 10/07/2015  Negative.   Dg Wrist Complete Right 10/07/2015   No acute fracture or subluxation. Degenerative changes as described above.   [place] Extrem Up Right Ltd 10/07/2015  Generalized soft tissue edema along the dorsal aspect of the wrist and hand concerning for cellulitis. 1.3 x 1.9 x 0.6 cm fluid  collection along the dorsal aspect of the wrist concerning for a large joint effusion or ganglion cyst. If there is further clinical concern, recommend evaluation with MRI.   Dg Hand Complete Right 10/07/2015  Normal right hand.  Scheduled Meds: . amLODipine  5 mg Oral Daily  . heparin  5,000 Units Subcutaneous Q8H  . nicotine  21 mg Transdermal Daily  . piperacillin-tazobactam (ZOSYN)  IV  3.375 g Intravenous Q8H  . tamsulosin  0.4 mg Oral Daily  . vancomycin  1,500 mg Intravenous Q12H   Continuous Infusions: . sodium chloride 100 mL/hr at 10/07/15 1951     LOS: 1 day    Time spent: 25 minutes  Greater than 50% of the time spent on counseling and coordinating the care.   Manson Passey, MD [facility] Pager [phone]  If 7PM-7AM, please contact night-coverage [email or web address] Password [facility] 10/08/2015, 11:57 AM

## 2015-10-09 LAB — HEMOGLOBIN A1C
HEMOGLOBIN A1C: 5.7 % — AB (ref 4.8–5.6)
MEAN PLASMA GLUCOSE: 117 mg/dL

## 2015-10-09 NOTE — Progress Notes (Signed)
Patient ID: Aaron Roberson, male   DOB: 05/24/1970, 45 y.o.   MRN: [id number]  PROGRESS NOTE    Aaron Routedward Palmateer  NGE:[id number] DOB: 05/23/1970 DOA: 10/07/2015  PCP: Kaleen MaskELKINS,WILSON OLIVER, MD   Brief Narrative:  45 -year-old male with past medical history significant for IV drug abuse, tobacco use, hypertension who presented to [facility] with worsening swelling, redness and pain of right forearm and hand started one day prior to this admission. Patient reported using injectable hearing in that arm the day prior to the admission.  Patient was hemodynamically stable on the admission. He was afebrile. His blood work was notable for mild leukocytosis of 12.5 otherwise unremarkable. He was started on vancomycin and Zosyn. Orthopedic surgery has seen him in consultation.  Assessment & Plan:   Principal Problem:   Abscess and cellulitis or the right forearm and hand / leukocytosis - Plain films are negative for acute osseous findings but ultrasound of the right extremity shows 1.3 x 1.9 x 0.6 cm fluid collection along the dorsal aspect of the wrist concerning for a large joint effusion or ganglion cyst - Appreciate orthopedic surgery following, so far no plan for surgery. - He continues to have swelling and erythema of the right hand, can't appreciate significant improvement - Continue current antibiotics, vancomycin and Zosyn - We will continue pain management efforts  Active Problems: Essential hypertension - Continue Norvasc 5 mg daily - Blood pressure 131/97  Tobacco abuse - Nicotine patch ordered - Counseled on smoking cessation  IV drug abuse - Urine drug screen not obtainable at the time of the admission  DVT prophylaxis: Heparin subcutaneous Code Status: full code  Family Communication: No family at the bedside Disposition Plan: Home once cleared by orthopedic surgery   Consultants:   Orthopedic surgery  Procedures:   None  Antimicrobials:   Vancomycin  and Zosyn 10/07/2015 -->    Subjective: Says his pain is currently controlled.  Objective: Filed Vitals:   10/08/15 0542 10/08/15 1326 10/08/15 2248 10/09/15 0634  BP: 151/101 137/96 144/95 131/97  Pulse: 86 86 79 76  Temp: 98.2 F (36.8 C) 97.5 F (36.4 C) 97.9 F (36.6 C) 97.9 F (36.6 C)  TempSrc: Oral Axillary Oral Oral  Resp: 18 18 19 18   Height:      Weight:      SpO2: 99% 100% 98% 99%    Intake/Output Summary (Last 24 hours) at 10/09/15 0647 Last data filed at 10/09/15 [id number]  Gross per 24 hour  Intake 4187.92 ml  Output   2800 ml  Net 1387.92 ml   Filed Weights   10/07/15 1544 10/07/15 1942  Weight: 74.844 kg (165 lb) 74.844 kg (165 lb)    Examination:  General exam: Appears In no acute distress Respiratory system:  Respiratory effort normal. No wheezing Cardiovascular system: S1 & S2 appreciated, rate controlled Gastrointestinal system: Appreciate bowel sounds, nontender and nondistended abdomen Central nervous system: No focal neurological deficits. Extremities: Right hand swollen, erythematosus, no significant improvement since yesterday Skin: warm, dry  Psychiatry: Normal mood and behavior, not agitated  Data Reviewed: I have personally reviewed following labs and imaging studies  CBC:  Recent Labs Lab 10/07/15 1225 10/07/15 2008 10/08/15 0527  WBC 12.5* 9.6 8.3  NEUTROABS 10.1*  --   --   HGB 15.2 13.6 13.9  HCT 43.9 41.5 43.0  MCV 87.3 88.5 88.5  PLT 236 244 233   Basic Metabolic Panel:  Recent Labs Lab 10/07/15 1526 10/07/15 2008 10/08/15 [id number]  NA 136  --  137  K 3.8  --  3.8  CL 102  --  104  CO2 26  --  24  GLUCOSE 143*  --  121*  BUN 9  --  6  CREATININE 0.85 0.89 0.84  CALCIUM 8.8*  --  8.9  MG  --  1.9  --   PHOS  --  3.1  --    GFR: Estimated Creatinine Clearance: 120 mL/min (by C-G formula based on Cr of 0.84). Liver Function Tests:  Recent Labs Lab 10/07/15 2008  AST 18  ALT 12*  ALKPHOS 59  BILITOT 0.5    PROT 6.9  ALBUMIN 3.8   No results for input(s): LIPASE, AMYLASE in the last 168 hours. No results for input(s): AMMONIA in the last 168 hours. Coagulation Profile: No results for input(s): INR, PROTIME in the last 168 hours. Cardiac Enzymes: No results for input(s): CKTOTAL, CKMB, CKMBINDEX, TROPONINI in the last 168 hours. BNP (last 3 results) No results for input(s): PROBNP in the last 8760 hours. HbA1C:  Recent Labs  10/07/15 2008  HGBA1C 5.7*   CBG: No results for input(s): GLUCAP in the last 168 hours. Lipid Profile: No results for input(s): CHOL, HDL, LDLCALC, TRIG, CHOLHDL, LDLDIRECT in the last 72 hours. Thyroid Function Tests: No results for input(s): TSH, T4TOTAL, FREET4, T3FREE, THYROIDAB in the last 72 hours. Anemia Panel: No results for input(s): VITAMINB12, FOLATE, FERRITIN, TIBC, IRON, RETICCTPCT in the last 72 hours. Urine analysis:    Component Value Date/Time   COLORURINE YELLOW 06/30/2014 1950   APPEARANCEUR CLEAR 06/30/2014 1950   LABSPEC 1.020 06/30/2014 1950   PHURINE 5.5 06/30/2014 1950   GLUCOSEU NEGATIVE 06/30/2014 1950   HGBUR NEGATIVE 06/30/2014 1950   BILIRUBINUR NEGATIVE 06/30/2014 1950   KETONESUR NEGATIVE 06/30/2014 1950   PROTEINUR NEGATIVE 06/30/2014 1950   UROBILINOGEN 1.0 06/30/2014 1950   NITRITE NEGATIVE 06/30/2014 1950   LEUKOCYTESUR NEGATIVE 06/30/2014 1950   Sepsis Labs: @LABRCNTIP (procalcitonin:4,lacticidven:4)   ) Recent Results (from the past 240 hour(s))  Blood culture (routine x 2)     Status: None (Preliminary result)   Collection Time: 10/07/15 12:25 PM  Result Value Ref Range Status   Specimen Description BLOOD LEFT ARM  Final   Special Requests BOTTLES DRAWN AEROBIC AND ANAEROBIC 5 CC EA  Final   Culture   Final    NO GROWTH < 24 HOURS Performed at [facility]    Report Status PENDING  Incomplete  Blood culture (routine x 2)     Status: None (Preliminary result)   Collection Time: 10/07/15 12:25 PM   Result Value Ref Range Status   Specimen Description BLOOD LEFT HAND  Final   Special Requests BOTTLES DRAWN AEROBIC AND ANAEROBIC  Final   Culture   Final    NO GROWTH < 24 HOURS Performed at [facility]    Report Status PENDING  Incomplete      Radiology Studies: Dg Forearm Right 10/07/2015  Negative.   Dg Wrist Complete Right 10/07/2015   No acute fracture or subluxation. Degenerative changes as described above.   [place] Extrem Up Right Ltd 10/07/2015  Generalized soft tissue edema along the dorsal aspect of the wrist and hand concerning for cellulitis. 1.3 x 1.9 x 0.6 cm fluid collection along the dorsal aspect of the wrist concerning for a large joint effusion or ganglion cyst. If there is further clinical concern, recommend evaluation with MRI.   Dg Hand Complete Right  10/07/2015  Normal right hand.  Scheduled Meds: . amLODipine  5 mg Oral Daily  . heparin  5,000 Units Subcutaneous Q8H  . nicotine  21 mg Transdermal Daily  . piperacillin-tazobactam (ZOSYN)  IV  3.375 g Intravenous Q8H  . tamsulosin  0.4 mg Oral Daily  . vancomycin  1,500 mg Intravenous Q12H   Continuous Infusions: . sodium chloride 100 mL/hr at 10/09/15 0025     LOS: 2 days    Time spent: 15 minutes  Greater than 50% of the time spent on counseling and coordinating the care.   Manson Passey, MD [facility] Pager [phone]  If 7PM-7AM, please contact night-coverage [email or web address] Password [facility] 10/09/2015, 6:47 AM

## 2015-10-09 NOTE — Progress Notes (Signed)
Patient clinically improved this am with no surgical indications at this time  Would d/c on po abx and nsaids with followup in my office next week if needed

## 2015-10-10 NOTE — Progress Notes (Signed)
Pharmacy Antibiotic Note  Aaron Roberson is a 45 y.o. male admitted on 10/07/2015 with cellulitis and abscess.  Pharmacy has been consulted for vancomycin and zosyn dosing.  Cr has remained stable and WBC are wnl.  Plan is to continue IV antibiotics through 5/26, then change to PO.  Plan: Continue Vancomycin 1500mg  IV q12 Continue Zosyn 3.375g IV q8, infuse over 4hr F/U change to PO antibiotics  Height: 6' (182.9 cm) Weight: 165 lb (74.844 kg) IBW/kg (Calculated) : 77.6  Temp (24hrs), Avg:98.4 F (36.9 C), Min:98.4 F (36.9 C), Max:98.4 F (36.9 C)   Recent Labs Lab 10/07/15 1225 10/07/15 1526 10/07/15 2008 10/08/15 0527  WBC 12.5*  --  9.6 8.3  CREATININE  --  0.85 0.89 0.84    Estimated Creatinine Clearance: 120 mL/min (by C-G formula based on Cr of 0.84).    No Known Allergies  Antimicrobials this admission: Vanc 5/22 >> Zosyn 5/22 >>  Dose adjustments this admission: N/A  Microbiology results: 5/22 BCx x2  ntd  Thank you for allowing pharmacy to be a part of this patient's care.  Marisue HumbleKendra Magdalynn Davilla, PharmD Clinical Pharmacist [facility]

## 2015-10-10 NOTE — Care Management Note (Signed)
Case Management Note  Patient Details  Name: Aaron Roberson MRN: [id number] Date of Birth: 04/01/1971  Subjective/Objective:                    Action/Plan:  Planned discharge 10-11-15 . MATCH letter given and explained . Explained pain medicine not covered . Patient voiced understanding . [facility] information given . Patient states he has been there before. Expected Discharge Date:   (unknown)               Expected Discharge Plan:  Home/Self Care  In-House Referral:     Discharge planning Services  [facility] Consult[facility]  Post Acute Care Choice:    Choice offered to:     DME Arranged:    DME Agency:     HH Arranged:    HH Agency:     Status of Service:  Completed, signed off  Medicare Important Message Given:    Date Medicare IM Given:    Medicare IM give by:    Date Additional Medicare IM Given:    Additional Medicare Important Message give by:     If discussed at Long Length of Stay Meetings, dates discussed:    Additional Comments:  Kingsley PlanWile, Rupinder Livingston Marie, RN 10/10/2015, 2:42 PM

## 2015-10-10 NOTE — Progress Notes (Signed)
Patient ID: Aaron Roberson, male   DOB: December 30, 1970, 45 y.o.   MRN: [id number]  PROGRESS NOTE    Aaron Roberson  GNF:[id number] DOB: Sep 03, 1970 DOA: 10/07/2015  PCP: Kaleen Mask, MD   Brief Narrative:  45 -year-old male with past medical history significant for IV drug abuse, tobacco use, hypertension who presented to [facility] with worsening swelling, redness and pain of right forearm and hand started one day prior to this admission. Patient reported using injectable hearing in that arm the day prior to the admission.  Patient was hemodynamically stable on the admission. He was afebrile. His blood work was notable for mild leukocytosis of 12.5 otherwise unremarkable. He was started on vancomycin and Zosyn. Orthopedic surgery has seen him in consultation. So far no surgical interventions planned as pt getting better on abx treatment.   Assessment & Plan:   Principal Problem:   Abscess and cellulitis or the right forearm and hand / leukocytosis - Plain films are negative for acute osseous findings but ultrasound of the right extremity shows 1.3 x 1.9 x 0.6 cm fluid collection along the dorsal aspect of the wrist concerning for a large joint effusion or ganglion cyst - Patient has been seen by orthopedic surgery, so far no plans for surgery as erythema and swelling getting better with antibiotics - Continue with IV vancomycin and Zosyn through tomorrow and they changed to by mouth regimen as recommended by orthopedic surgery - We will continue pain management efforts  Active Problems: Essential hypertension - Continue Norvasc 5 mg daily  Tobacco abuse - Counseled on smoking cessation - Nicotine patch ordered  IV drug abuse - Urine drug screen not obtainable at the time of the admission   DVT prophylaxis: Heparin subcutaneous Code Status: full code  Family Communication: No family at the bedside Disposition Plan: Home once cleared by orthopedic  surgery   Consultants:   Orthopedic surgery  Procedures:   None  Antimicrobials:   Vancomycin and Zosyn 10/07/2015 -->    Subjective: Says pain better.   Objective: Filed Vitals:   10/09/15 0634 10/09/15 1341 10/09/15 2135 10/10/15 0549  BP: 131/97 110/70 142/82 152/96  Pulse: 76 72 71 71  Temp: 97.9 F (36.6 C) 97.9 F (36.6 C) 98.4 F (36.9 C) 98.4 F (36.9 C)  TempSrc: Oral Oral Oral Oral  Resp: Height:      Weight:      SpO2: 99% 98% 98% 98%    Intake/Output Summary (Last 24 hours) at 10/10/15 1007 Last data filed at 10/10/15 0840  Gross per 24 hour  Intake 1906.67 ml  Output   3750 ml  Net -1843.33 ml   Filed Weights   10/07/15 1544 10/07/15 1942  Weight: 74.844 kg (165 lb) 74.844 kg (165 lb)    Examination:  General exam: Appears calm and comfortable Respiratory system:  No wheezing, no rhonchi  Cardiovascular system: S1 & S2 (+), RRR Gastrointestinal system: (+) BS, non tender Central nervous system: Nonfocal  Extremities: Right hand swelling improving, palpable pulses  Skin: warm, dry, no open lesions Psychiatry: Normal behavior   Data Reviewed: I have personally reviewed following labs and imaging studies  CBC:  Recent Labs Lab 10/07/15 1225 10/07/15 2008 10/08/15 0527  WBC 12.5* 9.6 8.3  NEUTROABS 10.1*  --   --   HGB 15.2 13.6 13.9  HCT 43.9 41.5 43.0  MCV 87.3 88.5 88.5  PLT 236 244 233   Basic Metabolic Panel:  Recent Labs  Lab 10/07/15 1526 10/07/15 2008 10/08/15 0527  NA 136  --  137  K 3.8  --  3.8  CL 102  --  104  CO2 26  --  24  GLUCOSE 143*  --  121*  BUN 9  --  6  CREATININE 0.85 0.89 0.84  CALCIUM 8.8*  --  8.9  MG  --  1.9  --   PHOS  --  3.1  --    GFR: Estimated Creatinine Clearance: 120 mL/min (by C-G formula based on Cr of 0.84). Liver Function Tests:  Recent Labs Lab 10/07/15 2008  AST 18  ALT 12*  ALKPHOS 59  BILITOT 0.5  PROT 6.9  ALBUMIN 3.8   No results for input(s):  LIPASE, AMYLASE in the last 168 hours. No results for input(s): AMMONIA in the last 168 hours. Coagulation Profile: No results for input(s): INR, PROTIME in the last 168 hours. Cardiac Enzymes: No results for input(s): CKTOTAL, CKMB, CKMBINDEX, TROPONINI in the last 168 hours. BNP (last 3 results) No results for input(s): PROBNP in the last 8760 hours. HbA1C:  Recent Labs  10/07/15 2008  HGBA1C 5.7*   CBG: No results for input(s): GLUCAP in the last 168 hours. Lipid Profile: No results for input(s): CHOL, HDL, LDLCALC, TRIG, CHOLHDL, LDLDIRECT in the last 72 hours. Thyroid Function Tests: No results for input(s): TSH, T4TOTAL, FREET4, T3FREE, THYROIDAB in the last 72 hours. Anemia Panel: No results for input(s): VITAMINB12, FOLATE, FERRITIN, TIBC, IRON, RETICCTPCT in the last 72 hours. Urine analysis:    Component Value Date/Time   COLORURINE YELLOW 06/30/2014 1950   APPEARANCEUR CLEAR 06/30/2014 1950   LABSPEC 1.020 06/30/2014 1950   PHURINE 5.5 06/30/2014 1950   GLUCOSEU NEGATIVE 06/30/2014 1950   HGBUR NEGATIVE 06/30/2014 1950   BILIRUBINUR NEGATIVE 06/30/2014 1950   KETONESUR NEGATIVE 06/30/2014 1950   PROTEINUR NEGATIVE 06/30/2014 1950   UROBILINOGEN 1.0 06/30/2014 1950   NITRITE NEGATIVE 06/30/2014 1950   LEUKOCYTESUR NEGATIVE 06/30/2014 1950   Sepsis Labs: @LABRCNTIP (procalcitonin:4,lacticidven:4)   Recent Results (from the past 240 hour(s))  Blood culture (routine x 2)     Status: None (Preliminary result)   Collection Time: 10/07/15 12:25 PM  Result Value Ref Range Status   Specimen Description BLOOD LEFT ARM  Final   Special Requests BOTTLES DRAWN AEROBIC AND ANAEROBIC 5 CC EA  Final   Culture   Final    NO GROWTH 2 DAYS Performed at [facility]    Report Status PENDING  Incomplete  Blood culture (routine x 2)     Status: None (Preliminary result)   Collection Time: 10/07/15 12:25 PM  Result Value Ref Range Status   Specimen Description BLOOD  LEFT HAND  Final   Special Requests BOTTLES DRAWN AEROBIC AND ANAEROBIC 5ML  Final   Culture   Final    NO GROWTH 2 DAYS Performed at [facility]    Report Status PENDING  Incomplete      Radiology Studies: Dg Forearm Right 10/07/2015  Negative.   Dg Wrist Complete Right 10/07/2015   No acute fracture or subluxation. Degenerative changes as described above.   [place] Extrem Up Right Ltd 10/07/2015  Generalized soft tissue edema along the dorsal aspect of the wrist and hand concerning for cellulitis. 1.3 x 1.9 x 0.6 cm fluid collection along the dorsal aspect of the wrist concerning for a large joint effusion or ganglion cyst. If there is further clinical concern, recommend evaluation with MRI.  Dg Hand Complete Right 10/07/2015  Normal right hand.  Scheduled Meds: . amLODipine  5 mg Oral Daily  . heparin  5,000 Units Subcutaneous Q8H  . nicotine  21 mg Transdermal Daily  . piperacillin-tazobactam (ZOSYN)  IV  3.375 g Intravenous Q8H  . tamsulosin  0.4 mg Oral Daily  . vancomycin  1,500 mg Intravenous Q12H   Continuous Infusions: . sodium chloride 100 mL/hr at 10/10/15 0837     LOS: 3 days    Time spent: 15 minutes  Greater than 50% of the time spent on counseling and coordinating the care.   Manson Passey, MD [facility] Pager [phone]  If 7PM-7AM, please contact night-coverage [email or web address] Password [email or web address] 10/10/2015, 10:07 AM

## 2015-10-11 MED ORDER — CLINDAMYCIN HCL 300 MG PO CAPS: 300.0000 mg | ORAL_CAPSULE | Freq: Three times a day (TID) | ORAL | Status: DC

## 2015-10-11 MED ORDER — AMLODIPINE BESYLATE 5 MG PO TABS: 5.0000 mg | ORAL_TABLET | Freq: Every day | ORAL | Status: AC

## 2015-10-11 MED ORDER — TRAMADOL HCL 50 MG PO TABS: 50.0000 mg | ORAL_TABLET | Freq: Four times a day (QID) | ORAL | Status: DC | PRN

## 2015-10-11 NOTE — Discharge Summary (Signed)
Physician Discharge Summary  Ellene Routedward Bia WUJ:[id number] DOB: 10/09/1970 DOA: 10/07/2015  PCP: Kaleen MaskELKINS,WILSON OLIVER, MD  Admit date: 10/07/2015 Discharge date: 10/11/2015  Recommendations for Outpatient Follow-up:  1. Continue clindamycin for 7 days and discharge 300 mg 3 times daily  Discharge Diagnoses:  Principal Problem:   Abscess and cellulitis Active Problems:   Insomnia   HTN (hypertension)   Tobacco abuse   IV drug user   BPH (benign prostatic hyperplasia)   Cellulitis and abscess    Discharge Condition: stable   Diet recommendation: as tolerated   History of present illness:  45 -year-old male with past medical history significant for IV drug abuse, tobacco use, hypertension who [facility] with worsening swelling, redness and pain of right forearm and hand started one day prior to this admission. Patient reported using injectable hearing in that arm the day prior to the admission.  Patient was hemodynamically stable on the admission. He was afebrile. His blood work was notable for mild leukocytosis of 12.5 otherwise unremarkable. He was started on vancomycin and Zosyn. Orthopedic surgery has seen him in consultation. So far no surgical interventions planned as pt getting better on abx treatment.   Hospital Course:    Assessment & Plan:  Principal Problem:  Abscess and cellulitis or the right forearm and hand / leukocytosis - Plain films are negative for acute osseous findings but ultrasound of the right extremity shows 1.3 x 1.9 x 0.6 cm fluid collection along the dorsal aspect of the wrist concerning for a large joint effusion or ganglion cyst - Patient has been seen by orthopedic surgery, so far no plans for surgery as erythema and swelling getting better with antibiotics - He has received vancomycin and Zosyn from the time of the admission through today. He will continue clindamycin 300 mg 3 times daily for 7 days and discharge  Active  Problems: Essential hypertension - Continue Norvasc 5 mg daily  Tobacco abuse - Counseled on smoking cessation - Nicotine patch ordered in hospital     IV drug abuse - Urine drug screen not obtainable at the time of the admission   DVT prophylaxis: Heparin subcutaneous Code Status: full code  Family Communication: No family at the bedside    Consultants:   Orthopedic surgery  Procedures:   None  Antimicrobials:   Vancomycin and Zosyn 10/07/2015 --> 10/11/2015    Signed:  Manson PasseyEVINE, Haelee Bolen, MD  [facility] 10/11/2015, 9:33 AM  Pager #: [phone]  Time spent in minutes: less than 30 minutes   Discharge Exam: Filed Vitals:   10/10/15 2106 10/11/15 0542  BP: 112/80 135/91  Pulse: 67 68  Temp: 98 F (36.7 C) 98.3 F (36.8 C)  Resp: 17 18   Filed Vitals:   10/10/15 0549 10/10/15 1500 10/10/15 2106 10/11/15 0542  BP: 152/96 107/68 112/80 135/91  Pulse: 71 82 67 68  Temp: 98.4 F (36.9 C) 97.8 F (36.6 C) 98 F (36.7 C) 98.3 F (36.8 C)  TempSrc: Oral Oral Oral Oral  Resp: 17 18 17 18   Height:      Weight:      SpO2: 98% 99% 99% 100%    General: Pt is alert, follows commands appropriately, not in acute distress Cardiovascular: Regular rate and rhythm, S1/S2 +, no murmurs Respiratory: Clear to auscultation bilaterally, no wheezing, no crackles, no rhonchi Abdominal: Soft, non tender, non distended, bowel sounds +, no guarding Extremities: no edema, no cyanosis, pulses palpable bilaterally DP and PT; Right hand swelling  and erythema significantly improved this morning Neuro: Grossly nonfocal  Discharge Instructions  Discharge Instructions    Call MD for:  difficulty breathing, headache or visual disturbances    Complete by:  As directed      Call MD for:  persistant dizziness or light-headedness    Complete by:  As directed      Call MD for:  persistant nausea and vomiting    Complete by:  As directed      Call MD for:  severe  uncontrolled pain    Complete by:  As directed      Diet - low sodium heart healthy    Complete by:  As directed      Discharge instructions    Complete by:  As directed   1. Continue clindamycin for 7 days on discharge, 300 mg 3 times daily.     Increase activity slowly    Complete by:  As directed             Medication List    STOP taking these medications        diphenhydramine-acetaminophen 25-500 MG Tabs tablet  Commonly known as:  TYLENOL PM     tamsulosin 0.4 MG Caps capsule  Commonly known as:  FLOMAX      TAKE these medications        amLODipine 5 MG tablet  Commonly known as:  NORVASC  Take 1 tablet (5 mg total) by mouth daily.     clindamycin 300 MG capsule  Commonly known as:  CLEOCIN  Take 1 capsule (300 mg total) by mouth 3 (three) times daily.     traMADol 50 MG tablet  Commonly known as:  ULTRAM  Take 1 tablet (50 mg total) by mouth every 6 (six) hours as needed.           Follow-up Information    Follow up with Kaleen Mask, MD. Schedule an appointment as soon as possible for a visit in 1 week.   Specialty:  Family Medicine   Why:  Follow up appt after recent hospitalization   Contact information:   [address] [place] [address] [phone]        The results of significant diagnostics from this hospitalization (including imaging, microbiology, ancillary and laboratory) are listed below for reference.    Significant Diagnostic Studies: Dg Forearm Right  10/07/2015  CLINICAL DATA:  Right wrist pain right forearm pain starting this morning, no known injury EXAM: RIGHT FOREARM - 2 VIEW COMPARISON:  None. FINDINGS: Three views of the right forearm submitted. No acute fracture or subluxation IMPRESSION: Negative. Electronically Signed   By: Natasha Mead M.D.   On: 10/07/2015 12:15   Dg Wrist Complete Right  10/07/2015  CLINICAL DATA:  Right wrist pain starting this morning EXAM: RIGHT WRIST - COMPLETE 3+ VIEW COMPARISON:   06/14/2007 FINDINGS: Three views of the right wrist submitted. No acute fracture or subluxation. Mild narrowing of radiocarpal joint space. Degenerative changes are noted at the articulation of scaphoid with trapezium and trapezoid bone. Mild degenerative changes first carpometacarpal joint. Mild cystic degenerative changes anterior aspect of the scaphoid IMPRESSION: No acute fracture or subluxation. Degenerative changes as described above. Electronically Signed   By: Natasha Mead M.D.   On: 10/07/2015 12:19   [place] Extrem Up Right Ltd  10/07/2015  CLINICAL DATA:  Right wrist swelling. EXAM: ULTRASOUND RIGHT UPPER EXTREMITY LIMITED TECHNIQUE: Ultrasound examination of the upper extremity soft tissues was performed in  the area of clinical concern. COMPARISON:  NONE FINDINGS: Generalized soft tissue edema along the dorsal aspect of the wrist and hand. 1.3 x 1.9 x 0.6 cm fluid collection along the dorsal aspect of the wrist concerning for a large joint effusion or ganglion cyst No other solid or cystic mass. IMPRESSION: Generalized soft tissue edema along the dorsal aspect of the wrist and hand concerning for cellulitis. 1.3 x 1.9 x 0.6 cm fluid collection along the dorsal aspect of the wrist concerning for a large joint effusion or ganglion cyst. If there is further clinical concern, recommend evaluation with MRI. Electronically Signed   By: Elige Ko   On: 10/07/2015 14:02   Dg Hand Complete Right  10/07/2015  CLINICAL DATA:  Acute right hand pain and swelling without known injury. Initial encounter. EXAM: RIGHT HAND - COMPLETE 3+ VIEW COMPARISON:  None. FINDINGS: There is no evidence of fracture or dislocation. There is no evidence of arthropathy or other focal bone abnormality. Soft tissues are unremarkable. IMPRESSION: Normal right hand. Electronically Signed   By: Lupita Raider, M.D.   On: 10/07/2015 14:50    Microbiology: Recent Results (from the past 240 hour(s))  Blood culture (routine x 2)      Status: None (Preliminary result)   Collection Time: 10/07/15 12:25 PM  Result Value Ref Range Status   Specimen Description BLOOD LEFT ARM  Final   Special Requests BOTTLES DRAWN AEROBIC AND ANAEROBIC 5 CC EA  Final   Culture   Final    NO GROWTH 3 DAYS Performed at [facility]    Report Status PENDING  Incomplete  Blood culture (routine x 2)     Status: None (Preliminary result)   Collection Time: 10/07/15 12:25 PM  Result Value Ref Range Status   Specimen Description BLOOD LEFT HAND  Final   Special Requests BOTTLES DRAWN AEROBIC AND ANAEROBIC  Final   Culture   Final    NO GROWTH 3 DAYS Performed at [facility]    Report Status PENDING  Incomplete     Labs: Basic Metabolic Panel:  Recent Labs Lab 10/07/15 1526 10/07/15 2008 10/08/15 0527  NA 136  --  137  K 3.8  --  3.8  CL 102  --  104  CO2 26  --  24  GLUCOSE 143*  --  121*  BUN 9  --  6  CREATININE 0.85 0.89 0.84  CALCIUM 8.8*  --  8.9  MG  --  1.9  --   PHOS  --  3.1  --    Liver Function Tests:  Recent Labs Lab 10/07/15 2008  AST 18  ALT 12*  ALKPHOS 59  BILITOT 0.5  PROT 6.9  ALBUMIN 3.8   No results for input(s): LIPASE, AMYLASE in the last 168 hours. No results for input(s): AMMONIA in the last 168 hours. CBC:  Recent Labs Lab 10/07/15 1225 10/07/15 2008 10/08/15 0527  WBC 12.5* 9.6 8.3  NEUTROABS 10.1*  --   --   HGB 15.2 13.6 13.9  HCT 43.9 41.5 43.0  MCV 87.3 88.5 88.5  PLT 236 244 233   Cardiac Enzymes: No results for input(s): CKTOTAL, CKMB, CKMBINDEX, TROPONINI in the last 168 hours. BNP: BNP (last 3 results) No results for input(s): BNP in the last 8760 hours.  ProBNP (last 3 results) No results for input(s): PROBNP in the last 8760 hours.  CBG: No results for input(s): GLUCAP in the last 168 hours.

## 2015-10-11 NOTE — Clinical Social Work Note (Signed)
Clinical Social Work Assessment  Patient Details  Name: Aaron Roberson MRN: [id number] Date of Birth: 08/20/1970  Date of referral:  10/11/15               Reason for consult:  Substance Use/ETOH Abuse                Permission sought to share information with:    Permission granted to share information::     Name::        Agency::     Relationship::     Contact Information:     Housing/Transportation Living arrangements for the past 2 months:  Single Family Home Source of Information:  Patient Patient Interpreter Needed:  None Criminal Activity/Legal Involvement Pertinent to Current Situation/Hospitalization:  No - Comment as needed Significant Relationships:  Parents Lives with:  Parents Do you feel safe going back to the place where you live?  Yes Need for family participation in patient care:  No (Coment)  Care giving concerns:  Patient does not have any concerns   Office managerocial Worker assessment / plan:  Patient is a 45 year old male who is alert and oriented x4.  Patient lives with his dad, where he is helping take care of him.  Patient has substance abuse history, and was given a list of substance abuse programs residential and outpatient.  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  Patient did express some interest in substance abuse programs, and was appreciative of information given to him.  Patient was explained that it is up to him to decide if and when he decides to get help.  Patient is in agreement, and will look into programs.  Patient did not express any other concerns or issues.  Employment status:  Journalist, newspaperelf-Employed Insurance information:  Self Pay (Medicaid Pending) PT Recommendations:  Not assessed at this time Information / Referral to community resources:  Residential Substance Abuse Treatment Options, Outpatient Substance Abuse Treatment Options  Patient/Family's Response to care: Patient appreciative of  resources given for substance abuse.  Patient/Family's Understanding of and Emotional Response to Diagnosis, Current Treatment, and Prognosis:  Patient aware of current treatment and prognosis.  Emotional Assessment Appearance:  Appears stated age Attitude/Demeanor/Rapport:    Affect (typically observed):  Appropriate, Calm, Pleasant Orientation:  Oriented to Self, Oriented to Place, Oriented to  Time, Oriented to Situation Alcohol / Substance use:  Illicit Drugs Psych involvement (Current and /or in the community):  No (Comment)  Discharge Needs  Concerns to be addressed:  Substance Abuse Concerns Readmission within the last 30 days:  No Current discharge risk:  Substance Abuse Barriers to Discharge:  No Barriers Identified   Darleene Cleavernterhaus, Kaylina Cahue R, LCSWA 10/11/2015, 11:40 AM

## 2015-10-11 NOTE — Progress Notes (Signed)
Discharge papers gone over with pt. Prescriptions given to pt. No questions/complaints. IV taken out. Pt. D/c'd successfully.

## 2015-10-11 NOTE — Discharge Instructions (Signed)

## 2015-10-11 NOTE — Clinical Social Work Note (Signed)
CSW met with patient and provided resources for substance abuse, patient appreciative of information given.  CSW to sign off patient did not express any other concerns or issues.  Patient expressed he is ready to go home.  [name]. [place], MSW, [place] 10/11/2015 11:45 AM

## 2015-10-12 LAB — CULTURE, BLOOD (ROUTINE X 2)
Culture: NO GROWTH
Culture: NO GROWTH

## 2015-12-26 ENCOUNTER — Emergency Department (HOSPITAL_COMMUNITY)
Admission: EM | Admit: 2015-12-26 | Discharge: 2015-12-26 | Disposition: A | Discharge status: Home / Self Care | Payer: Self-pay | Attending: Emergency Medicine | Admitting: Emergency Medicine

## 2015-12-26 ENCOUNTER — Emergency Department (HOSPITAL_COMMUNITY): Payer: Self-pay

## 2015-12-26 ENCOUNTER — Encounter (HOSPITAL_COMMUNITY): Payer: Self-pay | Admitting: Emergency Medicine

## 2015-12-26 DIAGNOSIS — F1193 Opioid use, unspecified with withdrawal: Secondary | ICD-10-CM

## 2015-12-26 DIAGNOSIS — I10 Essential (primary) hypertension: Secondary | ICD-10-CM | POA: Insufficient documentation

## 2015-12-26 DIAGNOSIS — Z79899 Other long term (current) drug therapy: Secondary | ICD-10-CM | POA: Insufficient documentation

## 2015-12-26 DIAGNOSIS — F17210 Nicotine dependence, cigarettes, uncomplicated: Secondary | ICD-10-CM | POA: Insufficient documentation

## 2015-12-26 DIAGNOSIS — F1123 Opioid dependence with withdrawal: Secondary | ICD-10-CM | POA: Insufficient documentation

## 2015-12-26 DIAGNOSIS — R0789 Other chest pain: Secondary | ICD-10-CM | POA: Insufficient documentation

## 2015-12-26 HISTORY — DX: Other psychoactive substance abuse, uncomplicated: F19.10

## 2015-12-26 LAB — CBC
HEMATOCRIT: 49.2 % (ref 39.0–52.0)
Hemoglobin: 16.5 g/dL (ref 13.0–17.0)
MCH: 30.3 pg (ref 26.0–34.0)
MCHC: 33.5 g/dL (ref 30.0–36.0)
MCV: 90.4 fL (ref 78.0–100.0)
Platelets: 265 10*3/uL (ref 150–400)
RBC: 5.44 MIL/uL (ref 4.22–5.81)
RDW: 13.3 % (ref 11.5–15.5)
WBC: 6.7 10*3/uL (ref 4.0–10.5)

## 2015-12-26 LAB — BASIC METABOLIC PANEL
Anion gap: 10 (ref 5–15)
BUN: 9 mg/dL (ref 6–20)
CHLORIDE: 102 mmol/L (ref 101–111)
CO2: 25 mmol/L (ref 22–32)
Calcium: 9.9 mg/dL (ref 8.9–10.3)
Creatinine, Ser: 0.93 mg/dL (ref 0.61–1.24)
GFR calc Af Amer: >60 mL/min (ref >60)
GFR calc non Af Amer: >60 mL/min (ref >60)
GLUCOSE: 110 mg/dL — AB (ref 65–99)
POTASSIUM: 4.3 mmol/L (ref 3.5–5.1)
Sodium: 137 mmol/L (ref 135–145)

## 2015-12-26 LAB — I-STAT TROPONIN, ED
TROPONIN I, POC: 0 ng/mL (ref 0.00–0.08)
Troponin i, poc: 0 ng/mL (ref 0.00–0.08)

## 2015-12-26 MED ORDER — LOPERAMIDE HCL 2 MG PO CAPS: 2.0000 mg | ORAL_CAPSULE | Freq: Two times a day (BID) | ORAL | 0 refills | Status: DC | PRN

## 2015-12-26 MED ORDER — ASPIRIN 81 MG PO CHEW
324.0000 mg | CHEWABLE_TABLET | Freq: Once | ORAL | Status: AC
  Administered 2015-12-26: 324 mg via ORAL
  Filled 2015-12-26: qty 4

## 2015-12-26 MED ORDER — PROMETHAZINE HCL 25 MG/ML IJ SOLN
25.0000 mg | Freq: Once | INTRAMUSCULAR | Status: AC
  Administered 2015-12-26: 25 mg via INTRAMUSCULAR
  Filled 2015-12-26: qty 1

## 2015-12-26 MED ORDER — HYDROXYZINE HCL 25 MG PO TABS: 25.0000 mg | ORAL_TABLET | Freq: Four times a day (QID) | ORAL | 0 refills | Status: AC | PRN

## 2015-12-26 MED ORDER — DICYCLOMINE HCL 20 MG PO TABS: 20.0000 mg | ORAL_TABLET | Freq: Four times a day (QID) | ORAL | 0 refills | Status: DC | PRN

## 2015-12-26 MED ORDER — CLONIDINE HCL 0.1 MG/24HR TD PTWK
0.1000 mg | MEDICATED_PATCH | Freq: Once | TRANSDERMAL | Status: DC
  Administered 2015-12-26: 0.1 mg via TRANSDERMAL
  Filled 2015-12-26: qty 1

## 2015-12-26 MED ORDER — NAPROXEN 500 MG PO TABS: 500.0000 mg | ORAL_TABLET | Freq: Two times a day (BID) | ORAL | 0 refills | Status: DC | PRN

## 2015-12-26 MED ORDER — NAPROXEN 250 MG PO TABS
375.0000 mg | ORAL_TABLET | Freq: Once | ORAL | Status: AC
  Administered 2015-12-26: 375 mg via ORAL
  Filled 2015-12-26: qty 2

## 2015-12-26 MED ORDER — DICYCLOMINE HCL 10 MG PO CAPS
10.0000 mg | ORAL_CAPSULE | Freq: Once | ORAL | Status: AC
  Administered 2015-12-26: 10 mg via ORAL
  Filled 2015-12-26: qty 1

## 2015-12-26 MED ORDER — METHOCARBAMOL 500 MG PO TABS
1000.0000 mg | ORAL_TABLET | Freq: Once | ORAL | Status: AC
Start: 2015-12-26 — End: 2015-12-26
  Administered 2015-12-26: 1000 mg via ORAL
  Filled 2015-12-26: qty 2

## 2015-12-26 MED ORDER — ONDANSETRON HCL 4 MG PO TABS: 4.0000 mg | ORAL_TABLET | Freq: Four times a day (QID) | ORAL | 0 refills | Status: DC | PRN

## 2015-12-26 MED ORDER — METHOCARBAMOL 500 MG PO TABS: 500.0000 mg | ORAL_TABLET | Freq: Three times a day (TID) | ORAL | 0 refills | Status: DC | PRN

## 2015-12-26 NOTE — ED Notes (Signed)
Called Lab. Tech to redraw istat troponin.

## 2015-12-26 NOTE — Discharge Instructions (Signed)
Please follow with your primary care doctor in the next 2 days for a check-up. They must obtain records for further management.  ° °Do not hesitate to return to the Emergency Department for any new, worsening or concerning symptoms.  ° °

## 2015-12-26 NOTE — ED Triage Notes (Signed)
Patient here with central chest pain since am, states that he last used heroine last pm and wants to detox from same. Also complains of bilateral leg numbness. Alert and oriented, denies any other drug or etoh abuse

## 2015-12-26 NOTE — ED Provider Notes (Signed)
[facility]-EMERGENCY DEPT Provider Note   CSN: [id number] Arrival date & time: 12/26/15  1404  First Provider Contact:  First MD Initiated Contact with Patient 12/26/15 1516        History   Chief Complaint Chest pain and Heroin detox  HPI  Blood pressure (!) 148/122, pulse 115, temperature 97.2 F (36.2 C), temperature source Oral, resp. rate 20, height 6' (1.829 m), weight 66.7 kg, SpO2 99 %.  Aaron Roberson is a 45 y.o. male complaining of chest pain onset at 4 AM, did not wake him from sleep patient was not sleeping well because he is detoxing firm heroin. He last injected yesterday. States that he wants to quit. He has tried to find detox facilities but states that nobody has a bed. He feels anxious and is achy everywhere he has nausea with no vomiting. States that his right arm which is where he injects is intermittently numb, is not numb at this time he denies fever, chills, vomiting, diarrhea. States that the pain is retrosternal, worse with movement and palpation. He is also short of breath he denies fever or cough. Dates that he has a history of high blood pressure he doesn't take any medication for this. He smokes cigarettes does not do any cocaine or methamphetamine, no known family history of ACS he denies diagnosis of diabetes or hyperlipidemia.  HPI  Past Medical History:  Diagnosis Date  . Drug abuse   . Hypertension     Patient Active Problem List   Diagnosis Date Noted  . IV drug user 10/07/2015  . BPH (benign prostatic hyperplasia) 10/07/2015  . Cellulitis and abscess 10/07/2015  . Cellulitis of right upper extremity   . Intravenous drug abuse, episodic   . Abscess and cellulitis 06/30/2014  . HTN (hypertension) 06/30/2014  . Dysuria 06/30/2014  . Tobacco abuse 06/30/2014  . Knee pain 09/14/2010  . Insomnia 09/14/2010  . Weight loss 09/14/2010    Past Surgical History:  Procedure Laterality Date  . cyst removals    . KNEE SURGERY     x 3        Home Medications    Prior to Admission medications   Medication Sig Start Date End Date Taking? Authorizing Provider  amLODipine (NORVASC) 5 MG tablet Take 1 tablet (5 mg total) by mouth daily. Patient not taking: Reported on 12/26/2015 10/11/15   Alison Murray, MD  dicyclomine (BENTYL) 20 MG tablet Take 1 tablet (20 mg total) by mouth every 6 (six) hours as needed for spasms (Abdominal cramping). 12/26/15   Fantashia Shupert, PA-C  hydrOXYzine (ATARAX/VISTARIL) 25 MG tablet Take 1 tablet (25 mg total) by mouth every 6 (six) hours as needed for anxiety. 12/26/15   Tanee Henery, PA-C  loperamide (IMODIUM) 2 MG capsule Take 1 capsule (2 mg total) by mouth 2 (two) times daily as needed for diarrhea or loose stools. 12/26/15   Jaceyon Strole, PA-C  methocarbamol (ROBAXIN) 500 MG tablet Take 1 tablet (500 mg total) by mouth every 8 (eight) hours as needed for muscle spasms. 12/26/15   Joeanna Howdyshell, PA-C  naproxen (NAPROSYN) 500 MG tablet Take 1 tablet (500 mg total) by mouth 2 (two) times daily as needed (aching pain or discomfort). 12/26/15   Alexxander Kurt, PA-C  ondansetron (ZOFRAN) 4 MG tablet Take 1 tablet (4 mg total) by mouth every 6 (six) hours as needed for nausea or vomiting. 12/26/15   Wynetta Emery, PA-C    Family History Family History  Problem Relation Age  of Onset  . COPD Mother   . Stroke Mother   . Hypertension Mother   . Cancer Father     prostate  . Hypertension Father   . Cancer Paternal Grandmother     breast  . Arthritis Maternal Grandfather     Social History Social History  Substance Use Topics  . Smoking status: Current Every Day Smoker    Packs/day: 0.50    Types: Cigarettes  . Smokeless tobacco: Never Used  . Alcohol use Yes     Allergies   Review of patient's allergies indicates no known allergies.   Review of Systems Review of Systems  10 systems reviewed and found to be negative, except as noted in the HPI.   Physical  Exam Updated Vital Signs BP 118/79   Pulse 71   Temp 97.2 F (36.2 C) (Oral)   Resp (!) 27   Ht 6' (1.829 m)   Wt 66.7 kg   SpO2 96%   BMI 19.94 kg/m   Physical Exam  Constitutional: He is oriented to person, place, and time. He appears well-developed and well-nourished. No distress.  HENT:  Head: Normocephalic.  Mouth/Throat: Oropharynx is clear and moist.  Eyes: Conjunctivae are normal. Pupils are equal, round, and reactive to light.  Neck: Normal range of motion. No JVD present. No tracheal deviation present.  Cardiovascular: Regular rhythm and intact distal pulses.   Mild tachycardia, Radial pulse equal bilaterally  Pulmonary/Chest: Effort normal and breath sounds normal. No stridor. No respiratory distress. He has no wheezes. He has no rales. He exhibits tenderness.  Chest pain is reproducible to palpation along the lower sternum.  Abdominal: Soft. He exhibits no distension and no mass. There is no tenderness. There is no rebound and no guarding.  Musculoskeletal: Normal range of motion. He exhibits no edema or tenderness.  No calf asymmetry, superficial collaterals, palpable cords, edema, Homans sign negative bilaterally.    Neurological: He is alert and oriented to person, place, and time.  Skin: Skin is warm. Capillary refill takes less than 2 seconds. He is not diaphoretic.  Psychiatric: He has a normal mood and affect.  Nursing note and vitals reviewed.    ED Treatments / Results  Labs (all labs ordered are listed, but only abnormal results are displayed) Labs Reviewed  BASIC METABOLIC PANEL - Abnormal; Notable for the following:       Result Value   Glucose, Bld 110 (*)    All other components within normal limits  CBC  I-STAT TROPOININ, ED  I-STAT TROPOININ, ED    EKG  EKG Interpretation  Date/Time:  Thursday December 26 2015 14:12:37 EDT Ventricular Rate:  103 PR Interval:  138 QRS Duration: 86 QT Interval:  340 QTC Calculation: 445 R  Axis:   83 Text Interpretation:  Sinus tachycardia Minimal voltage criteria for LVH, may be normal variant Borderline ECG No acute changes No old tracing to compare Confirmed by ISAACS MD, CAMERON [phone]) on 12/26/2015 4:16:42 PM       Radiology Dg Chest 2 View  Result Date: 12/26/2015 CLINICAL DATA:  Chest pain and shortness of breath for 2 days. EXAM: CHEST  2 VIEW COMPARISON:  Chest CT, 12/23/2014.  Chest radiograph, 05/02/2013. FINDINGS: Cardiac silhouette is normal in size and configuration. Normal mediastinal and hilar contours. Lungs are mildly hyperexpanded but clear. No pleural effusion or pneumothorax. Bony thorax is intact. IMPRESSION: No active cardiopulmonary disease. Electronically Signed   By: Amie Portland M.D.   On: 12/26/2015  14:45    Procedures Procedures (including critical care time)  Medications Ordered in ED Medications  cloNIDine (CATAPRES - Dosed in mg/24 hr) patch 0.1 mg (0.1 mg Transdermal Patch Applied 12/26/15 1625)  aspirin chewable tablet 324 mg (324 mg Oral Given 12/26/15 1624)  methocarbamol (ROBAXIN) tablet 1,000 mg (1,000 mg Oral Given 12/26/15 1622)  promethazine (PHENERGAN) injection 25 mg (25 mg Intramuscular Given 12/26/15 1626)  naproxen (NAPROSYN) tablet 375 mg (375 mg Oral Given 12/26/15 1623)  dicyclomine (BENTYL) capsule 10 mg (10 mg Oral Given 12/26/15 1623)     Initial Impression / Assessment and Plan / ED Course  I have reviewed the triage vital signs and the nursing notes.  Pertinent labs & imaging results that were available during my care of the patient were reviewed by me and considered in my medical decision making (see chart for details).  Clinical Course  Value Comment By Time  ED EKG within 10 minutes (Reviewed) Shaune Pollack, MD 08/10 1616    Vitals:   12/26/15 1410 12/26/15 1600 12/26/15 1715 12/26/15 1815  BP:  140/97 121/76 118/79  Pulse:  74 66 71  Resp:   (!) 30 (!) 27  Temp:      TempSrc:      SpO2:  98% 99% 96%   Weight: 66.7 kg     Height: 6' (1.829 m)       Medications  cloNIDine (CATAPRES - Dosed in mg/24 hr) patch 0.1 mg (0.1 mg Transdermal Patch Applied 12/26/15 1625)  aspirin chewable tablet 324 mg (324 mg Oral Given 12/26/15 1624)  methocarbamol (ROBAXIN) tablet 1,000 mg (1,000 mg Oral Given 12/26/15 1622)  promethazine (PHENERGAN) injection 25 mg (25 mg Intramuscular Given 12/26/15 1626)  naproxen (NAPROSYN) tablet 375 mg (375 mg Oral Given 12/26/15 1623)  dicyclomine (BENTYL) capsule 10 mg (10 mg Oral Given 12/26/15 1623)    Roc Streett is 45 y.o. male presenting with Withdrawal from IV heroin, patient also has chest pain that is reproducible to palpation. EKG with no acute changes, he is low risk by hear score. Full dose aspirin given and patient is  First troponin is negative, will obtain delta troponin, patient is given full dose aspirin. He is low risk by heart score. Physical exam is not consistent with DVT. I think his tachycardia is likely secondary to his withdrawal from opiates, I doubt PE.  Discussed case with attending physician who agrees with care plan and disposition.   Second troponin is negative. Patient is given outpatient detox resources. Evaluation does not show pathology that would require ongoing emergent intervention or inpatient treatment. Pt is hemodynamically stable and mentating appropriately. Discussed findings and plan with patient/guardian, who agrees with care plan. All questions answered. Return precautions discussed and outpatient follow up given.    Final Clinical Impressions(s) / ED Diagnoses   Final diagnoses:  Chest wall pain  Opiate withdrawal (HCC)    New Prescriptions New Prescriptions   DICYCLOMINE (BENTYL) 20 MG TABLET    Take 1 tablet (20 mg total) by mouth every 6 (six) hours as needed for spasms (Abdominal cramping).   HYDROXYZINE (ATARAX/VISTARIL) 25 MG TABLET    Take 1 tablet (25 mg total) by mouth every 6 (six) hours as needed for  anxiety.   LOPERAMIDE (IMODIUM) 2 MG CAPSULE    Take 1 capsule (2 mg total) by mouth 2 (two) times daily as needed for diarrhea or loose stools.   METHOCARBAMOL (ROBAXIN) 500 MG TABLET    Take 1 tablet (  500 mg total) by mouth every 8 (eight) hours as needed for muscle spasms.   NAPROXEN (NAPROSYN) 500 MG TABLET    Take 1 tablet (500 mg total) by mouth 2 (two) times daily as needed (aching pain or discomfort).   ONDANSETRON (ZOFRAN) 4 MG TABLET    Take 1 tablet (4 mg total) by mouth every 6 (six) hours as needed for nausea or vomiting.     Wynetta Emeryicole Kaydin Labo, PA-C 12/26/15 2005    Shaune Pollackameron Isaacs, MD 12/27/15 [phone]
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
## 2017-03-31 ENCOUNTER — Emergency Department (HOSPITAL_COMMUNITY)
Admission: EM | Admit: 2017-03-31 | Discharge: 2017-03-31 | Disposition: A | Discharge status: Home / Self Care | Payer: No Typology Code available for payment source | Attending: Physician Assistant | Admitting: Physician Assistant

## 2017-03-31 ENCOUNTER — Emergency Department (HOSPITAL_COMMUNITY): Payer: No Typology Code available for payment source

## 2017-03-31 ENCOUNTER — Other Ambulatory Visit: Payer: Self-pay

## 2017-03-31 ENCOUNTER — Encounter (HOSPITAL_COMMUNITY): Payer: Self-pay

## 2017-03-31 DIAGNOSIS — M25561 Pain in right knee: Secondary | ICD-10-CM | POA: Insufficient documentation

## 2017-03-31 DIAGNOSIS — R51 Headache: Secondary | ICD-10-CM | POA: Diagnosis not present

## 2017-03-31 DIAGNOSIS — R1013 Epigastric pain: Secondary | ICD-10-CM | POA: Diagnosis not present

## 2017-03-31 DIAGNOSIS — M542 Cervicalgia: Secondary | ICD-10-CM | POA: Diagnosis not present

## 2017-03-31 DIAGNOSIS — M25512 Pain in left shoulder: Secondary | ICD-10-CM | POA: Diagnosis not present

## 2017-03-31 DIAGNOSIS — R079 Chest pain, unspecified: Secondary | ICD-10-CM | POA: Diagnosis not present

## 2017-03-31 DIAGNOSIS — W2210XA Striking against or struck by unspecified automobile airbag, initial encounter: Secondary | ICD-10-CM | POA: Insufficient documentation

## 2017-03-31 LAB — BASIC METABOLIC PANEL
ANION GAP: 11 (ref 5–15)
BUN: 10 mg/dL (ref 6–20)
CHLORIDE: 100 mmol/L — AB (ref 101–111)
CO2: 24 mmol/L (ref 22–32)
CREATININE: 0.76 mg/dL (ref 0.61–1.24)
Calcium: 9.5 mg/dL (ref 8.9–10.3)
GFR calc non Af Amer: >60 mL/min (ref >60)
Glucose, Bld: 100 mg/dL — ABNORMAL HIGH (ref 65–99)
POTASSIUM: 4 mmol/L (ref 3.5–5.1)
SODIUM: 135 mmol/L (ref 135–145)

## 2017-03-31 LAB — CBC WITH DIFFERENTIAL/PLATELET
BASOS ABS: 0.1 10*3/uL (ref 0.0–0.1)
BASOS PCT: 1 %
Eosinophils Absolute: 0.3 10*3/uL (ref 0.0–0.7)
Eosinophils Relative: 5 %
HEMATOCRIT: 41.9 % (ref 39.0–52.0)
HEMOGLOBIN: 14.6 g/dL (ref 13.0–17.0)
LYMPHS PCT: 16 %
Lymphs Abs: 1.1 10*3/uL (ref 0.7–4.0)
MCH: 30.4 pg (ref 26.0–34.0)
MCHC: 34.8 g/dL (ref 30.0–36.0)
MCV: 87.1 fL (ref 78.0–100.0)
MONO ABS: 0.8 10*3/uL (ref 0.1–1.0)
Monocytes Relative: 12 %
Neutro Abs: 4.4 10*3/uL (ref 1.7–7.7)
Neutrophils Relative %: 66 %
Platelets: 234 10*3/uL (ref 150–400)
RBC: 4.81 MIL/uL (ref 4.22–5.81)
RDW: 13.4 % (ref 11.5–15.5)
WBC: 6.6 10*3/uL (ref 4.0–10.5)

## 2017-03-31 LAB — ETHANOL: Alcohol, Ethyl (B): <10 mg/dL (ref <10)

## 2017-03-31 MED ORDER — IOPAMIDOL (ISOVUE-300) INJECTION 61%
100.0000 mL | Freq: Once | INTRAVENOUS | Status: AC | PRN
  Administered 2017-03-31: 100 mL via INTRAVENOUS

## 2017-03-31 MED ORDER — IOPAMIDOL (ISOVUE-300) INJECTION 61%
INTRAVENOUS | Status: AC
  Filled 2017-03-31: qty 100

## 2017-03-31 MED ORDER — CYCLOBENZAPRINE HCL 10 MG PO TABS
5.0000 mg | ORAL_TABLET | Freq: Once | ORAL | Status: AC
  Administered 2017-03-31: 5 mg via ORAL
  Filled 2017-03-31: qty 1

## 2017-03-31 MED ORDER — OXYCODONE-ACETAMINOPHEN 5-325 MG PO TABS
1.0000 | ORAL_TABLET | Freq: Once | ORAL | Status: AC
  Administered 2017-03-31: 1 via ORAL
  Filled 2017-03-31: qty 1

## 2017-03-31 MED ORDER — NAPROXEN 500 MG PO TABS: 500.0000 mg | ORAL_TABLET | Freq: Two times a day (BID) | ORAL | 0 refills | Status: AC | PRN

## 2017-03-31 MED ORDER — CYCLOBENZAPRINE HCL 10 MG PO TABS: 10.0000 mg | ORAL_TABLET | Freq: Two times a day (BID) | ORAL | 0 refills | Status: AC | PRN

## 2017-03-31 NOTE — ED Notes (Signed)
PT DENIES USE OF DRUGS AND ALCOHOL.

## 2017-03-31 NOTE — ED Notes (Signed)
PT NOT IN ROOM

## 2017-03-31 NOTE — ED Notes (Signed)
Patient transported to CT 
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
## 2017-03-31 NOTE — ED Notes (Signed)
PT STATES HIS GRIP HAS NOT BEEN THE BEST HOWEVER OVER THE LAST SEVERAL WEEKS HAS GOT WORSE. WORSE IN THE LEFT HAND

## 2017-03-31 NOTE — Discharge Instructions (Signed)
It was my pleasure taking care of you today!   Naproxen as needed for pain. Flexeril is your muscle relaxer to take as needed. Apply ice to affected areas as needed for pain. Keep leg elevated to help with knee swelling.   Please call the orthopedist listed for further discussion of your shoulder / arm pain.   Return to ER for new or worsening symptoms, any additional concerns.

## 2017-03-31 NOTE — ED Notes (Signed)
EDPA Provider at bedside. 
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
## 2019-11-02 ENCOUNTER — Emergency Department (HOSPITAL_COMMUNITY): Payer: Self-pay

## 2019-11-02 ENCOUNTER — Encounter (HOSPITAL_COMMUNITY): Payer: Self-pay | Admitting: Emergency Medicine

## 2019-11-02 ENCOUNTER — Emergency Department (HOSPITAL_COMMUNITY)
Admission: EM | Admit: 2019-11-02 | Discharge: 2019-11-02 | Disposition: A | Discharge status: Home / Self Care | Payer: Self-pay | Attending: Emergency Medicine | Admitting: Emergency Medicine

## 2019-11-02 DIAGNOSIS — R109 Unspecified abdominal pain: Secondary | ICD-10-CM

## 2019-11-02 DIAGNOSIS — F17210 Nicotine dependence, cigarettes, uncomplicated: Secondary | ICD-10-CM | POA: Insufficient documentation

## 2019-11-02 DIAGNOSIS — I10 Essential (primary) hypertension: Secondary | ICD-10-CM | POA: Insufficient documentation

## 2019-11-02 DIAGNOSIS — Z7982 Long term (current) use of aspirin: Secondary | ICD-10-CM | POA: Insufficient documentation

## 2019-11-02 DIAGNOSIS — Z79899 Other long term (current) drug therapy: Secondary | ICD-10-CM | POA: Insufficient documentation

## 2019-11-02 DIAGNOSIS — K5900 Constipation, unspecified: Secondary | ICD-10-CM | POA: Insufficient documentation

## 2019-11-02 LAB — BASIC METABOLIC PANEL
Anion gap: 9 (ref 5–15)
BUN: 15 mg/dL (ref 6–20)
CO2: 23 mmol/L (ref 22–32)
Calcium: 8.8 mg/dL — ABNORMAL LOW (ref 8.9–10.3)
Chloride: 107 mmol/L (ref 98–111)
Creatinine, Ser: 0.86 mg/dL (ref 0.61–1.24)
GFR calc Af Amer: >60 mL/min (ref >60)
GFR calc non Af Amer: >60 mL/min (ref >60)
Glucose, Bld: 90 mg/dL (ref 70–99)
Potassium: 4.2 mmol/L (ref 3.5–5.1)
Sodium: 139 mmol/L (ref 135–145)

## 2019-11-02 LAB — URINALYSIS, ROUTINE W REFLEX MICROSCOPIC
Bilirubin Urine: NEGATIVE
Glucose, UA: NEGATIVE mg/dL
Hgb urine dipstick: NEGATIVE
Ketones, ur: NEGATIVE mg/dL
Leukocytes,Ua: NEGATIVE
Nitrite: NEGATIVE
Protein, ur: NEGATIVE mg/dL
Specific Gravity, Urine: 1.023 (ref 1.005–1.030)
pH: 5 (ref 5.0–8.0)

## 2019-11-02 LAB — CBC
HCT: 49.7 % (ref 39.0–52.0)
Hemoglobin: 16.9 g/dL (ref 13.0–17.0)
MCH: 32.6 pg (ref 26.0–34.0)
MCHC: 34 g/dL (ref 30.0–36.0)
MCV: 95.9 fL (ref 80.0–100.0)
Platelets: 180 10*3/uL (ref 150–400)
RBC: 5.18 MIL/uL (ref 4.22–5.81)
RDW: 13.7 % (ref 11.5–15.5)
WBC: 5.4 10*3/uL (ref 4.0–10.5)
nRBC: 0 % (ref 0.0–0.2)

## 2019-11-02 MED ORDER — OXYCODONE-ACETAMINOPHEN 5-325 MG PO TABS
1.0000 | ORAL_TABLET | Freq: Once | ORAL | Status: AC
  Administered 2019-11-02: 1 via ORAL
  Filled 2019-11-02: qty 1

## 2019-11-02 NOTE — ED Triage Notes (Signed)
Per pt, states he has been having right lower back pain for a few days-states now pain has gone into right testicle

## 2019-11-02 NOTE — Discharge Instructions (Addendum)
The testing does not show that you have a kidney stone or urinary tract infection.  Your pain may be related to constipation.  Try to increase the fiber in your diet using the attached instructions.  Also to treat constipation get a bottle of magnesium citrate and drink it fully over a few hours.  Also starting MiraLAX, a stool softener, and taking it twice a day is helpful.  Continue doing that until you have daily soft bowel movements then cut it back to once a day, until you are feeling better.  See the doctor of your choice if needed for problems.

## 2019-11-02 NOTE — ED Provider Notes (Signed)
[facility] DEPT Provider Note   CSN: [id number] Arrival date & time: 11/02/19  1512     History Chief Complaint  Patient presents with  . Flank Pain    Cordarrel Stiefel is a 49 y.o. male.  HPI He presents for evaluation of right flank pain, rating to right testicle, onset over the last 3 days and worsening.  He also notes that his urine "smells bad and is dark."  He denies fever, nausea, vomiting, weakness or dizziness.  No prior similar problem.  No history of kidney stones.  No known sick contacts.  He denies urethral drainage.  There are no other known modifying factors.    Past Medical History:  Diagnosis Date  . Drug abuse ([place])   . Hypertension     Patient Active Problem List   Diagnosis Date Noted  . IV drug user 10/07/2015  . BPH (benign prostatic hyperplasia) 10/07/2015  . Cellulitis and abscess 10/07/2015  . Cellulitis of right upper extremity   . 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 Abscess and cellulitis 06/30/2014  . HTN (hypertension) 06/30/2014  . Dysuria 06/30/2014  . Tobacco abuse 06/30/2014  . Knee pain 09/14/2010  . Insomnia 09/14/2010  . Weight loss 09/14/2010    Past Surgical History:  Procedure Laterality Date  . cyst removals    . KNEE SURGERY     x 3       Family History  Problem Relation Age of Onset  . COPD Mother   . Stroke Mother   . Hypertension Mother   . Cancer Father        prostate  . Hypertension Father   . Cancer Paternal Grandmother        breast  . Arthritis Maternal Grandfather     Social History   Tobacco Use  . Smoking status: Current Every Day Smoker    Packs/day: 0.50    Types: Cigarettes  . Smokeless tobacco: Never Used  Substance Use Topics  . Alcohol use: Yes  . Drug use: Yes    Types: IV    Home Medications Prior to Admission medications   Medication Sig Start Date End Date Taking? Authorizing Provider  aspirin EC 325 MG tablet Take 325 mg by mouth every 6  (six) hours as needed for moderate pain.   Yes [provider]  doxylamine, Sleep, (SLEEP AID) 25 MG tablet Take 25 mg by mouth at bedtime as needed for sleep.   Yes [provider]  amLODipine (NORVASC) 5 MG tablet Take 1 tablet (5 mg total) by mouth daily. Patient not taking: Reported on 12/26/2015 10/11/15   Robbie Lis, MD  cyclobenzaprine (FLEXERIL) 10 MG tablet Take 1 tablet (10 mg total) 2 (two) times daily as needed by mouth for muscle spasms. Patient not taking: Reported on 11/02/2019 03/31/17   Ward, Ozella Almond, PA-C  hydrOXYzine (ATARAX/VISTARIL) 25 MG tablet Take 1 tablet (25 mg total) by mouth every 6 (six) hours as needed for anxiety. Patient not taking: Reported on 03/31/2017 12/26/15   Pisciotta, Elmyra Ricks, PA-C  naproxen (NAPROSYN) 500 MG tablet Take 1 tablet (500 mg total) 2 (two) times daily as needed by mouth. Patient not taking: Reported on 11/02/2019 03/31/17   Ward, Ozella Almond, PA-C  traZODone (DESYREL) 50 MG tablet Take 0.5 tablets (25 mg total) by mouth at bedtime as needed for sleep. Patient not taking: Reported on 12/23/2014 07/05/14 12/30/14  Lance Bosch, NP    Allergies  Patient has no known allergies.  Review of Systems   Review of Systems  All other systems reviewed and are negative.   Physical Exam Updated Vital Signs BP (!) 168/104   Pulse 65   Temp 98.4 F (36.9 C) (Oral)   Resp 16   Ht 6' (1.829 m)   Wt 74.8 kg   SpO2 97%   BMI 22.38 kg/m   Physical Exam Vitals and nursing note reviewed.  Constitutional:      General: He is in acute distress (He is uncomfortable).     Appearance: He is well-developed. He is not ill-appearing, toxic-appearing or diaphoretic.  HENT:     Head: Normocephalic and atraumatic.     Right Ear: External ear normal.     Left Ear: External ear normal.  Eyes:     Conjunctiva/sclera: Conjunctivae normal.     Pupils: Pupils are equal, round, and reactive to light.  Neck:     Trachea: Phonation  normal.  Cardiovascular:     Rate and Rhythm: Normal rate.  Pulmonary:     Effort: Pulmonary effort is normal.  Abdominal:     General: There is no distension.     Tenderness: There is no abdominal tenderness.  Musculoskeletal:        General: Normal range of motion.     Cervical back: Normal range of motion and neck supple.  Skin:    General: Skin is warm and dry.  Neurological:     Mental Status: He is alert and oriented to person, place, and time.     Cranial Nerves: No cranial nerve deficit.     Sensory: No sensory deficit.     Motor: No abnormal muscle tone.     Coordination: Coordination normal.  Psychiatric:        Mood and Affect: Mood normal.        Behavior: Behavior normal.        Thought Content: Thought content normal.        Judgment: Judgment normal.     ED Results / Procedures / Treatments   Labs (all labs ordered are listed, but only abnormal results are displayed) Labs Reviewed  BASIC METABOLIC PANEL - Abnormal; Notable for the following components:      Result Value   Calcium 8.8 (*)    All other components within normal limits  URINALYSIS, ROUTINE W REFLEX MICROSCOPIC  CBC    EKG None  Radiology CT Renal Stone Study  Result Date: 11/02/2019 CLINICAL DATA:  Right flank pain EXAM: CT ABDOMEN AND PELVIS WITHOUT CONTRAST TECHNIQUE: Multidetector CT imaging of the abdomen and pelvis was performed following the standard protocol without IV contrast. COMPARISON:  12/22/2014 FINDINGS: Lower chest: Lung bases are clear. No effusions. Heart is normal size. Hepatobiliary: No focal hepatic abnormality. Gallbladder unremarkable. Pancreas: No focal abnormality or ductal dilatation. Spleen: No focal abnormality.  Normal size. Adrenals/Urinary Tract: No adrenal abnormality. No focal renal abnormality. No stones or hydronephrosis. Urinary bladder is unremarkable. Stomach/Bowel: Normal appendix. Moderate stool burden in the colon. Stomach, large and small bowel grossly  unremarkable. Vascular/Lymphatic: Aortic atherosclerosis. No enlarged abdominal or pelvic lymph nodes. Reproductive: No visible focal abnormality. Other: No free fluid or free air. Musculoskeletal: No acute bony abnormality. IMPRESSION: No renal or ureteral stones.  No hydronephrosis. Normal appendix. Moderate stool burden throughout the colon. Aortic atherosclerosis. Electronically Signed   By: Charlett Nose M.D.   On: 11/02/2019 20:37    Procedures Procedures (including critical care time)  Medications Ordered in ED Medications  oxyCODONE-acetaminophen (PERCOCET/ROXICET) 5-325 MG per tablet 1 tablet (1 tablet Oral Given 11/02/19 2015)    ED Course  I have reviewed the triage vital signs and the nursing notes.  Pertinent labs & imaging results that were available during my care of the patient were reviewed by me and considered in my medical decision making (see chart for details).  Clinical Course as of Nov 01 2133  Thu Nov 02, 2019  2128 Normal  Urinalysis, Routine w reflex microscopic- may I&O cath if menses [EW]  2129 Normal except calcium slightly low  Basic metabolic panel(!) [EW]  2129 Normal  CBC [EW]  2129 Per radiologist, normal except increased stool burden.  CT Renal [facility] [place] [EW]    Clinical Course User Index [EW] Mancel Bale, MD   MDM Rules/Calculators/A&P                           Patient Vitals for the past 24 hrs:  BP Temp Temp src Pulse Resp SpO2 Height Weight  11/02/19 1942 (!) 168/104 98.4 F (36.9 C) Oral 65 16 97 % -- --  11/02/19 1525 -- -- -- -- -- -- 6' (1.829 m) 74.8 kg  11/02/19 1522 (!) 187/119 (!) 97.5 F (36.4 C) Oral 73 18 100 % -- --    9:28 PM Reevaluation with update and discussion. After initial assessment and treatment, an updated evaluation reveals no change in status he remains comfortable.  Findings discussed with the patient and all questions were answered. Mancel Bale   Medical Decision Making:  This patient is presenting  for evaluation of right flank pain, which does require a range of treatment options, and is a complaint that involves a moderate risk of morbidity and mortality. The differential diagnoses include urinary tract infection, obstructing kidney stone, intra-abdominal abnormality. I decided to review old records, and in summary healthy young male, with history of substance abuse.  I did not require additional historical information from anyone.  Clinical Laboratory Tests Ordered, included CBC, Metabolic panel and Urinalysis. Review indicates these tests are normal. Radiologic Tests Ordered, included CT abdomen pelvis.  I independently Visualized: CT images, which show increased stool, no hydronephrosis or obstructing kidney stone   Critical Interventions-clinical evaluation, laboratory testing, CT imaging, observation, medication treatment and reassessment  After These Interventions, the Patient was reevaluated and was found stable for discharge.  Suspect constipation causing pain.  CRITICAL CARE-no Performed by: Mancel Bale  Nursing Notes Reviewed/ Care Coordinated Applicable Imaging Reviewed Interpretation of Laboratory Data incorporated into ED treatment  The patient appears reasonably screened and/or stabilized for discharge and I doubt any other medical condition or other [facility] requiring further screening, evaluation, or treatment in the ED at this time prior to discharge.  Plan: Home Medications-OTC analgesia of choice, magnesium citrate and MiraLAX for constipation; Home Treatments-increase fiber in diet; return here if the recommended treatment, does not improve the symptoms; Recommended follow up-PCP of choice as needed     Final Clinical Impression(s) / ED Diagnoses Final diagnoses:  Flank pain  Constipation, unspecified constipation type    Rx / DC Orders ED Discharge Orders    None       Mancel Bale, MD 11/02/19 2135
